# Patient Record
Sex: Male | Born: 1990
Health system: Southern US, Community
[De-identification: ages and names within clinical notes are randomized; demographics above are authoritative.]

## PROBLEM LIST (undated history)

## (undated) DIAGNOSIS — T7840XA Allergy, unspecified, initial encounter: Secondary | ICD-10-CM

## (undated) DIAGNOSIS — J45909 Unspecified asthma, uncomplicated: Secondary | ICD-10-CM

## (undated) DIAGNOSIS — R011 Cardiac murmur, unspecified: Secondary | ICD-10-CM

## (undated) DIAGNOSIS — I441 Atrioventricular block, second degree: Secondary | ICD-10-CM

## (undated) DIAGNOSIS — D573 Sickle-cell trait: Secondary | ICD-10-CM

## (undated) DIAGNOSIS — I319 Disease of pericardium, unspecified: Secondary | ICD-10-CM

## (undated) HISTORY — DX: Allergy, unspecified, initial encounter: T78.40XA

## (undated) HISTORY — DX: Unspecified asthma, uncomplicated: J45.909

## (undated) HISTORY — DX: Sickle-cell trait: D57.3

## (undated) HISTORY — DX: Atrioventricular block, second degree: I44.1

## (undated) HISTORY — DX: Cardiac murmur, unspecified: R01.1

---

## 2011-02-18 ENCOUNTER — Emergency Department (HOSPITAL_COMMUNITY)
Admission: EM | Admit: 2011-02-18 | Discharge: 2011-02-19 | Disposition: A | Discharge status: Home / Self Care | Payer: BC Managed Care – PPO | Attending: Emergency Medicine | Admitting: Emergency Medicine

## 2011-02-18 DIAGNOSIS — Y9229 Other specified public building as the place of occurrence of the external cause: Secondary | ICD-10-CM | POA: Insufficient documentation

## 2011-02-18 DIAGNOSIS — M25579 Pain in unspecified ankle and joints of unspecified foot: Secondary | ICD-10-CM | POA: Insufficient documentation

## 2011-02-18 DIAGNOSIS — X500XXA Overexertion from strenuous movement or load, initial encounter: Secondary | ICD-10-CM | POA: Insufficient documentation

## 2011-02-18 DIAGNOSIS — Y9367 Activity, basketball: Secondary | ICD-10-CM | POA: Insufficient documentation

## 2011-02-18 DIAGNOSIS — M25476 Effusion, unspecified foot: Secondary | ICD-10-CM | POA: Insufficient documentation

## 2011-02-18 DIAGNOSIS — S93409A Sprain of unspecified ligament of unspecified ankle, initial encounter: Secondary | ICD-10-CM | POA: Insufficient documentation

## 2011-02-18 DIAGNOSIS — M25473 Effusion, unspecified ankle: Secondary | ICD-10-CM | POA: Insufficient documentation

## 2011-02-19 ENCOUNTER — Emergency Department (HOSPITAL_COMMUNITY): Payer: BC Managed Care – PPO

## 2012-03-06 ENCOUNTER — Ambulatory Visit (INDEPENDENT_AMBULATORY_CARE_PROVIDER_SITE_OTHER): Payer: BC Managed Care – PPO | Admitting: Family Medicine

## 2012-03-06 VITALS — BP 119/73 | HR 87 | Temp 98.2°F | Resp 16 | Ht 67.5 in | Wt <= 1120 oz

## 2012-03-06 DIAGNOSIS — R05 Cough: Secondary | ICD-10-CM

## 2012-03-06 DIAGNOSIS — J069 Acute upper respiratory infection, unspecified: Secondary | ICD-10-CM

## 2012-03-06 DIAGNOSIS — J309 Allergic rhinitis, unspecified: Secondary | ICD-10-CM

## 2012-03-06 DIAGNOSIS — R509 Fever, unspecified: Secondary | ICD-10-CM

## 2012-03-06 DIAGNOSIS — R059 Cough, unspecified: Secondary | ICD-10-CM

## 2012-03-06 DIAGNOSIS — J029 Acute pharyngitis, unspecified: Secondary | ICD-10-CM

## 2012-03-06 LAB — POCT RAPID STREP A (OFFICE): Rapid Strep A Screen: NEGATIVE

## 2012-03-06 MED ORDER — FLUTICASONE PROPIONATE 50 MCG/ACT NA SUSP: 2.0000 | Freq: Every day | NASAL | Status: AC

## 2012-03-06 MED ORDER — HYDROCODONE-HOMATROPINE 5-1.5 MG/5ML PO SYRP: 5.0000 mL | ORAL_SOLUTION | Freq: Three times a day (TID) | ORAL | Status: AC | PRN

## 2012-03-06 NOTE — Progress Notes (Signed)
=   [facility]:  Office Visit  Chief Complaint:  Chief Complaint  Patient presents with  . Cough    prod. yellow phlegm  . Nasal Congestion    fever 100.8 (on Monday) x 3 days    HPI: Daniel Gill is a 21 y.o. male who complains of chest congestions and ptoductive cough, T max on Monday of 100.8, tried Aleve, nothing for cough. + Allergies but no Asthma. Not SOB, wheezing. No ear pain. + sore throat.    Past Medical History  Diagnosis Date  . Allergy    History reviewed. No pertinent past surgical history. History   Social History  . Marital Status: Single    Spouse Name: N/A    Number of Children: N/A  . Years of Education: N/A   Social History Main Topics  . Smoking status: Never Smoker   . Smokeless tobacco: None  . Alcohol Use: No  . Drug Use: No  . Sexually Active: None   Other Topics Concern  . None   Social History Narrative  . None   No family history on file. Allergies  Allergen Reactions  . Latex Swelling  . Sulfa Drugs Cross Reactors Hives and Swelling   Prior to Admission medications   Not on File     ROS: The patient denies fevers, chills, night sweats, unintentional weight loss, chest pain, palpitations, wheezing, dyspnea on exertion, nausea, vomiting, abdominal pain, dysuria, hematuria, melena, numbness, weakness, or tingling. + cough, sorethroat  All other systems have been reviewed and were otherwise negative with the exception of those mentioned in the HPI and as above.    PHYSICAL EXAM: Filed Vitals:   03/06/12 1539  BP: 119/73  Pulse: 87  Temp: 98.2 F (36.8 C)  Resp: 16   Filed Vitals:   03/06/12 1539  Height: 5' 7.5" (1.715 m)  Weight: 63 lb 9.6 oz (28.849 kg)   Body mass index is 9.81 kg/(m^2).  General: Alert, no acute distress HEENT:  Normocephalic, atraumatic, oropharynx patent. TM normal, throat no exudates, erythematous throat. Boggy nares Cardiovascular:  Regular rate and rhythm, no rubs  murmurs or gallops.  No Carotid bruits, radial pulse intact. No pedal edema.  Respiratory: Clear to auscultation bilaterally.  No wheezes, rales, or rhonchi.  No cyanosis, no use of accessory musculature GI: No organomegaly, abdomen is soft and non-tender, positive bowel sounds.  No masses. Skin: No rashes. Neurologic: Facial musculature symmetric. Psychiatric: Patient is appropriate throughout our interaction. Lymphatic: No cervical lymphadenopathy Musculoskeletal: Gait intact.   LABS: Results for orders placed in visit on 03/06/12  POCT RAPID STREP A (OFFICE)      Component Value Range   Rapid Strep A Screen Negative  Negative      EKG/XRAY:   Primary read interpreted by Dr. Conley Rolls at [facility].   ASSESSMENT/PLAN: Encounter Diagnoses  Name Primary?  . Sore throat Yes  . Viral URI with cough   . Allergic rhinitis   . Cough   . Fever    1. OTC meds: antihistamine,  rx flonase 2. OTC cough medicine, gave rx for hydromet prn 3. Sxs treatment for now.     Hamilton Capri PHUONG, DO 03/06/2012 4:46 PM

## 2012-12-20 ENCOUNTER — Ambulatory Visit (INDEPENDENT_AMBULATORY_CARE_PROVIDER_SITE_OTHER): Payer: BC Managed Care – PPO | Admitting: Family Medicine

## 2012-12-20 VITALS — BP 117/76 | HR 67 | Temp 97.9°F | Resp 16 | Ht 68.0 in | Wt 157.2 lb

## 2012-12-20 DIAGNOSIS — H8110 Benign paroxysmal vertigo, unspecified ear: Secondary | ICD-10-CM

## 2012-12-20 MED ORDER — MECLIZINE HCL 12.5 MG PO TABS: 12.5000 mg | ORAL_TABLET | Freq: Three times a day (TID) | ORAL | Status: DC | PRN

## 2012-12-20 NOTE — Progress Notes (Signed)
22 yo [employer] worker with dizziness x 24 hours, abrupt onset upon arising.  Symptoms worsen with sudden movement.  No loss of hearing  Or ear pain.  Some slight ringing in ears, but he notes exposure to loud noise.  No nausea or vomiting.  Obj:  NAD Normal TM's, eomi, Perla, no nystagmus, no cervical adenopathy, chest is clear, heart reg without murmur Neuro:  Alert, coop, normal CN III-XII and normal motor  Assessment:  vertigo

## 2012-12-20 NOTE — Patient Instructions (Addendum)
Benign Positional Vertigo  Vertigo means you feel like you or your surroundings are moving when they are not. Benign positional vertigo is the most common form of vertigo. Benign means that the cause of your condition is not serious. Benign positional vertigo is more common in older adults.  CAUSES   Benign positional vertigo is the result of an upset in the labyrinth system. This is an area in the middle ear that helps control your balance. This may be caused by a viral infection, head injury, or repetitive motion. However, often no specific cause is found.  SYMPTOMS   Symptoms of benign positional vertigo occur when you move your head or eyes in different directions. Some of the symptoms may include:  · Loss of balance and falls.  · Vomiting.  · Blurred vision.  · Dizziness.  · Nausea.  · Involuntary eye movements (nystagmus).  DIAGNOSIS   Benign positional vertigo is usually diagnosed by physical exam. If the specific cause of your benign positional vertigo is unknown, your caregiver may perform imaging tests, such as magnetic resonance imaging (MRI) or computed tomography (CT).  TREATMENT   Your caregiver may recommend movements or procedures to correct the benign positional vertigo. Medicines such as meclizine, benzodiazepines, and medicines for nausea may be used to treat your symptoms. In rare cases, if your symptoms are caused by certain conditions that affect the inner ear, you may need surgery.  HOME CARE INSTRUCTIONS   · Follow your caregiver's instructions.  · Move slowly. Do not make sudden body or head movements.  · Avoid driving.  · Avoid operating heavy machinery.  · Avoid performing any tasks that would be dangerous to you or others during a vertigo episode.  · Drink enough fluids to keep your urine clear or pale yellow.  SEEK IMMEDIATE MEDICAL CARE IF:   · You develop problems with walking, weakness, numbness, or using your arms, hands, or legs.  · You have difficulty speaking.  · You develop  severe headaches.  · Your nausea or vomiting continues or gets worse.  · You develop visual changes.  · Your family or friends notice any behavioral changes.  · Your condition gets worse.  · You have a fever.  · You develop a stiff neck or sensitivity to light.  MAKE SURE YOU:   · Understand these instructions.  · Will watch your condition.  · Will get help right away if you are not doing well or get worse.  Document Released: 09/04/2006 Document Revised: 02/19/2012 Document Reviewed: 08/17/2011  [employer]® Patient Information ©2013 [employer].

## 2013-03-03 ENCOUNTER — Ambulatory Visit: Payer: BC Managed Care – PPO

## 2013-03-03 ENCOUNTER — Ambulatory Visit (INDEPENDENT_AMBULATORY_CARE_PROVIDER_SITE_OTHER): Payer: BC Managed Care – PPO | Admitting: Family Medicine

## 2013-03-03 VITALS — BP 118/78 | HR 64 | Temp 98.4°F | Resp 18 | Ht 68.0 in | Wt 154.0 lb

## 2013-03-03 DIAGNOSIS — M25579 Pain in unspecified ankle and joints of unspecified foot: Secondary | ICD-10-CM

## 2013-03-03 DIAGNOSIS — M25571 Pain in right ankle and joints of right foot: Secondary | ICD-10-CM

## 2013-03-03 DIAGNOSIS — S9031XA Contusion of right foot, initial encounter: Secondary | ICD-10-CM

## 2013-03-03 DIAGNOSIS — S9030XA Contusion of unspecified foot, initial encounter: Secondary | ICD-10-CM

## 2013-03-03 NOTE — Progress Notes (Signed)
  Subjective:    Patient ID: Daniel Gill, male    DOB: [birth year], 22 y.o.   MRN: [id number]  HPI 22 year old male presentss with acute right ankle injury. He works at [employer] and on 02/27/13 he dropped a package that struck his right anterior ankle.  No sprain or strain.  He has been using an old short cam walker that he had from a prior ankle injury - this does help him ambulate and decreases the pain, but he does admit that it has become increasingly painful over the last few days. Denies paresthesias or weakness.  He has iced the area but not taken any OTC medications for this.    He is otherwise healthy with no other concerns today.     Review of Systems  Constitutional: Negative for fever and chills.  Musculoskeletal: Positive for joint swelling, arthralgias and gait problem.  Skin: Negative for color change and wound.  Neurological: Negative for dizziness.       Objective:   Physical Exam  Constitutional: He is oriented to person, place, and time. He appears well-developed and well-nourished.  HENT:  Head: Normocephalic and atraumatic.  Right Ear: External ear normal.  Left Ear: External ear normal.  Neck: Normal range of motion.  Cardiovascular: Normal rate, regular rhythm and normal heart sounds.   Pulmonary/Chest: Effort normal and breath sounds normal.  Musculoskeletal:       Right ankle: He exhibits decreased range of motion (secondary to pain). He exhibits no swelling, no ecchymosis, no laceration and normal pulse. Tenderness. AITFL tenderness found. No lateral malleolus, no medial malleolus and no head of 5th metatarsal tenderness found.  Neurological: He is alert and oriented to person, place, and time.  Psychiatric: He has a normal mood and affect. His behavior is normal. Judgment and thought content normal.     [facility] reading (PRIMARY) by  Dr. Conley Rolls as no acute bony abnoromality.       Assessment & Plan:   Pain in joint, ankle and foot, right - Plan: DG Ankle  Complete Right  Contusion, foot, right, initial encounter  Continue to wear short cam as needed. As pain improves, try short distances without it.  Ibuprofen 600 mg tid for pain and inflammation Ice and elevation Out of work until 03/10/13. If pain continues through next week, need to RTC for further evaluation.

## 2013-03-03 NOTE — Patient Instructions (Addendum)
Ibuprofen 600 mg three times a day Ice and elevation Follow up in 1 week if pain persists.

## 2013-03-19 ENCOUNTER — Ambulatory Visit (INDEPENDENT_AMBULATORY_CARE_PROVIDER_SITE_OTHER): Payer: BC Managed Care – PPO | Admitting: Family Medicine

## 2013-03-19 VITALS — BP 118/88 | HR 64 | Temp 98.1°F | Resp 16 | Ht 68.0 in | Wt 154.0 lb

## 2013-03-19 DIAGNOSIS — M25579 Pain in unspecified ankle and joints of unspecified foot: Secondary | ICD-10-CM

## 2013-03-19 DIAGNOSIS — M25571 Pain in right ankle and joints of right foot: Secondary | ICD-10-CM

## 2013-03-19 DIAGNOSIS — T148XXA Other injury of unspecified body region, initial encounter: Secondary | ICD-10-CM

## 2013-03-19 NOTE — Progress Notes (Signed)
[facility]:  Office Visit  Chief Complaint:  Chief Complaint  Patient presents with  . Follow-up    foot pain    HPI: Daniel Gill is a 22 y.o. male who complains of  Right ankle pain which he was evaluated for 2 weeks ago. Was playing basketball about 3 weeks ago and may have strained it, he has had priuor ankle injuries. Sharp pain, intermittent, no radiation. Gets numb after walking on it for a while and weightbearing. It has improved with no working but then he went back to work at [employer] and starting lifting heavy weights and started limping--avg weight 70 lbs. No prior surgeries on foot or ankles. Has tried ibuprofen 400 mg QID wihtout relief, also uses his short camwalker. He has to wera steel toe shoes at work, tried insoles but did not help. No exercises at home.   Past Medical History  Diagnosis Date  . Allergy    No past surgical history on file. History   Social History  . Marital Status: Single    Spouse Name: N/A    Number of Children: N/A  . Years of Education: N/A   Social History Main Topics  . Smoking status: Never Smoker   . Smokeless tobacco: None  . Alcohol Use: No  . Drug Use: No  . Sexually Active: None   Other Topics Concern  . None   Social History Narrative  . None   No family history on file. Allergies  Allergen Reactions  . Latex Swelling  . Sulfa Drugs Cross Reactors Hives and Swelling   Prior to Admission medications   Medication Sig Start Date End Date Taking? Authorizing Provider  cetirizine (ZYRTEC) 10 MG tablet Take 10 mg by mouth daily.   Yes Historical Provider, MD  meclizine (ANTIVERT) 12.5 MG tablet Take 1 tablet (12.5 mg total) by mouth 3 (three) times daily as needed. 12/20/12   Elvina Sidle, MD     ROS: The patient denies fevers, chills, night sweats, unintentional weight loss, chest pain, palpitations, wheezing, dyspnea on exertion, nausea, vomiting, abdominal pain, dysuria, hematuria, melena,  weakness, or tingling. + numbness,   All other systems have been reviewed and were otherwise negative with the exception of those mentioned in the HPI and as above.    PHYSICAL EXAM: Filed Vitals:   03/19/13 1215  BP: 118/88  Pulse: 64  Temp: 98.1 F (36.7 C)  Resp: 16   Filed Vitals:   03/19/13 1215  Height: 5\' 8"  (1.727 m)  Weight: 154 lb (69.854 kg)   Body mass index is 23.42 kg/(m^2).  General: Alert, no acute distress HEENT:  Normocephalic, atraumatic, oropharynx patent.  Cardiovascular:  Regular rate and rhythm, no rubs murmurs or gallops.  No Carotid bruits, radial pulse intact. No pedal edema.  Respiratory: Clear to auscultation bilaterally.  No wheezes, rales, or rhonchi.  No cyanosis, no use of accessory musculature GI: No organomegaly, abdomen is soft and non-tender, positive bowel sounds.  No masses. Skin: No rashes. Neurologic: Facial musculature symmetric. Psychiatric: Patient is appropriate throughout our interaction. Lymphatic: No cervical lymphadenopathy Musculoskeletal: Gait intact. Right  foot-nl Right ankle-+ tenderness on squeeze test, neg ant drawer, 5/5 strength, sensation intact, + DP, good cap refill   LABS: Results for orders placed in visit on 03/06/12  POCT RAPID STREP A (OFFICE)      Result Value Range   Rapid Strep A Screen Negative  Negative     EKG/XRAY:   Primary read  interpreted by Dr. Conley Rolls at [facility].   ASSESSMENT/PLAN: Encounter Diagnoses  Name Primary?  . Pain in joint, ankle and foot, right Yes  . Sprain and strain    Most likely high ankle sprain He was improving but got worse after started working nad walking on hard cement at [employer] in steel toe shoes Will defer xrays until next visit if needed Gave ROM and exercises to do.  C/w cam walker, increase ibuprfen to 600 mg TID with food.  Note for work was given  F/u in 1 week     Mitchell Iwanicki PHUONG, DO 03/19/2013 1:18 PM

## 2013-03-19 NOTE — Patient Instructions (Signed)
Acute Ankle Sprain with Phase II Rehab An acute ankle sprain is a partial or complete tear in one or more of the ligaments of the ankle due to traumatic injury. The severity of the injury depends on both the number of ligaments sprained and the grade of sprain. There are 3 grades of sprains.  A grade 1 sprain is a mild sprain. There is a slight pull without obvious tearing. There is no loss of strength, and the muscle and ligament are the correct length.  A grade 2 sprain is a moderate sprain. There is tearing of fibers within the substance of the ligament where it connects two bones or two cartilages. The length of the ligament is increased, and there is usually decreased strength.  A grade 3 sprain is a complete rupture of the ligament and is uncommon. In addition to the grade of sprain, there are 3 types of ankle sprains.  Lateral ankle sprains. This is a sprain of one or more of the 3 ligaments on the outer side (lateral) of the ankle. These are the most common sprains. Medial ankle sprains. There is one large triangular ligament on the inner side (medial) of the ankle that is susceptible to injury. Medial ankle sprains are less common. Syndesmosis, "high ankle," sprains. The syndesmosis is the ligament that connects the two bones of the lower leg. Syndesmosis sprains usually only occur with very severe ankle sprains. SYMPTOMS  Pain, tenderness, and swelling in the ankle, starting at the side of injury that may progress to the whole ankle and foot with time.  "Pop" or tearing sensation at the time of injury.  Bruising that may spread to the heel.  Impaired ability to walk soon after injury. CAUSES   Acute ankle sprains are caused by trauma placed on the ankle that temporarily forces or pries the anklebone (talus) out of its normal socket.  Stretching or tearing of the ligaments that normally hold the joint in place (usually due to a twisting injury). RISK INCREASES WITH:  Previous  ankle sprain.  Sports in which the foot may land awkwardly (basketball, volleyball, soccer) or walking or running on uneven or rough surfaces.  Shoes with inadequate support to prevent sideways motion when stress occurs.  Poor strength and flexibility.  Poor balance skills.  Contact sports. PREVENTION  Warm up and stretch properly before activity.  Maintain physical fitness:  Ankle and leg flexibility, muscle strength, and endurance.  Cardiovascular fitness.  Balance training activities.  Use proper technique and have a coach correct improper technique.  Taping, protective strapping, bracing, or high-top tennis shoes may help prevent injury. Initially, tape is best. However, it loses most of its support function within 10 to 15 minutes.  Wear proper fitted protective shoes. Combining high-top shoes with taping or bracing is more effective than using either alone.  Provide the ankle with support during sports and practice activities for 12 months following injury. PROGNOSIS   If treated properly, ankle sprains can be expected to recover completely. However, the length of recovery depends on the degree of injury.  A grade 1 sprain usually heals enough in 5 to 7 days to allow modified activity and requires an average of 6 weeks to heal completely.  A grade 2 sprain requires 6 to 10 weeks to heal completely.  A grade 3 sprain requires 12 to 16 weeks to heal.  A syndesmosis sprain often takes more than 3 months to heal. RELATED COMPLICATIONS   Frequent recurrence of symptoms may result   in a chronic problem. Appropriately addressing the problem the first time decreases the frequency of recurrence and optimizes healing time. Severity of initial sprain does not predict the likelihood of later instability.  Injury to other structures (bone, cartilage, or tendon).  Chronically unstable or arthritic ankle joint are possible with repeated sprains. TREATMENT Treatment initially  involves the use of ice, medicine, and compression bandages to help reduce pain and inflammation. Ankle sprains are usually immobilized in a walking cast or boot to allow for healing. Crutches may be recommended to reduce pressure on the injury. After immobilization, strengthening and stretching exercises may be necessary to regain strength and a full range of motion. Surgery is rarely needed to treat ankle sprains. MEDICATION   Nonsteroidal anti-inflammatory medicines, such as aspirin and ibuprofen (do not take for the first 3 days after injury or within 7 days before surgery), or other minor pain relievers, such as acetaminophen, are often recommended. Take these as directed by your caregiver. Contact your caregiver immediately if any bleeding, stomach upset, or signs of an allergic reaction occur from these medicines.  Ointments applied to the skin may be helpful.  Pain relievers may be prescribed as necessary by your caregiver. Do not take prescription pain medicine for longer than 4 to 7 days. Use only as directed and only as much as you need. HEAT AND COLD  Cold treatment (icing) is used to relieve pain and reduce inflammation for acute and chronic cases. Cold should be applied for 10 to 15 minutes every 2 to 3 hours for inflammation and pain and immediately after any activity that aggravates your symptoms. Use ice packs or an ice massage.  Heat treatment may be used before performing stretching and strengthening activities prescribed by your caregiver. Use a heat pack or a warm soak. SEEK IMMEDIATE MEDICAL CARE IF:   Pain, swelling, or bruising worsens despite treatment.  You experience pain, numbness, discoloration, or coldness in the foot or toes.  New, unexplained symptoms develop. (Drugs used in treatment may produce side effects.) EXERCISES  PHASE II EXERCISES RANGE OF MOTION (ROM) AND STRETCHING EXERCISES - Ankle Sprain, Acute-Phase II, Weeks 3 to 4 After your physician, physical  therapist, or athletic trainer feels your knee has made progress significant enough to begin more advanced exercises, he or she may recommend completing some of the following exercises. Although each person heals at different rates, most people will be ready for these exercises between 3 and 4 weeks after their injury. Do not begin these exercises until you have your caregiver's permission. He or she may also advise you to continue with the exercises which you completed in Phase I of your rehabilitation. While completing these exercises, remember:   Restoring tissue flexibility helps normal motion to return to the joints. This allows healthier, less painful movement and activity.  An effective stretch should be held for at least 30 seconds.  A stretch should never be painful. You should only feel a gentle lengthening or release in the stretched tissue. RANGE OF MOTION - Ankle Plantar Flexion   Sit with your right / left leg crossed over your opposite knee.  Use your opposite hand to pull the top of your foot and toes toward you.  You should feel a gentle stretch on the top of your foot/ankle. Hold this position for __________. Repeat __________ times. Complete __________ times per day.  RANGE OF MOTION - Ankle Eversion  Sit with your right / left ankle crossed over your opposite knee.    Grip your foot with your opposite hand, placing your thumb on the top of your foot and your fingers across the bottom of your foot.  Gently push your foot downward with a slight rotation so your littlest toes rise slightly  You should feel a gentle stretch on the inside of your ankle. Hold the stretch for __________ seconds. Repeat __________ times. Complete this exercise __________ times per day.  RANGE OF MOTION - Ankle Inversion  Sit with your right / left ankle crossed over your opposite knee.  Grip your foot with your opposite hand, placing your thumb on the bottom of your foot and your fingers across  the top of your foot.  Gently pull your foot so the smallest toe comes toward you and your thumb pushes the inside of the ball of your foot away from you.  You should feel a gentle stretch on the outside of your ankle. Hold the stretch for __________ seconds. Repeat __________ times. Complete this exercise __________ times per day.  STRETCH - Gastrocsoleus  Sit with your right / left leg extended. Holding onto both ends of a belt or towel, loop it around the ball of your foot.  Keeping your right / left ankle and foot relaxed and your knee straight, pull your foot and ankle toward you using the belt/towel.  You should feel a gentle stretch behind your calf or knee. Hold this position for __________ seconds. Repeat __________ times. Complete this stretch __________ times per day.  RANGE OF MOTION - Ankle Dorsiflexion, Active Assisted  Remove shoes and sit on a chair that is preferably not on a carpeted surface.  Place right / left foot under knee. Extend your opposite leg for support.  Keeping your heel down, slide your right / left foot back toward the chair until you feel a stretch at your ankle or calf. If you do not feel a stretch, slide your bottom forward to the edge of the chair while still keeping your heel down.  Hold this stretch for __________ seconds. Repeat __________ times. Complete this stretch __________ times per day.  STRETCH  Gastroc, Standing   Place hands on wall.  Extend right / left leg and place a folded washcloth under the arch of your foot for support. Keep the front knee somewhat bent.  Slightly point your toes inward on your back foot.  Keeping your right / left heel on the floor and your knee straight, shift your weight toward the wall, not allowing your back to arch.  You should feel a gentle stretch in the calf. Hold this position for __________ seconds. Repeat __________ times. Complete this stretch __________ times per day. STRETCH  Soleus,  Standing  Place hands on wall.  Extend right / left leg and place a folded washcloth under the arch of your foot for support. Keep the front knee somewhat bent.  Slightly point your toes inward on your back foot.  Keep your right / left heel on the floor, bend your back knee, and slightly shift your weight over the back leg so that you feel a gentle stretch deep in your back calf.  Hold this position for __________ seconds. Repeat __________ times. Complete this stretch __________ times per day. STRETCH  Gastrocsoleus, Standing Note: This exercise can place a lot of stress on your foot and ankle. Please complete this exercise only if specifically instructed by your caregiver.   Place the ball of your right / left foot on a step, keeping your other  foot firmly on the same step.  Hold on to the wall or a rail for balance.  Slowly lift your other foot, allowing your body weight to press your heel down over the edge of the step.  You should feel a stretch in your right / left calf.  Hold this position for __________ seconds.  Repeat this exercise with a slight bend in your knee. Repeat __________ times. Complete this stretch __________ times per day.  STRENGTHENING EXERCISES - Ankle Sprain, Acute-Phase II Around 3 to 4 weeks after your injury, you may progress to some of these exercises in your rehabilitation program. Do not begin these until you have your caregiver's permission. Although your condition has improved, the Phase I exercises will continue to be helpful and you may continue to complete them. As you complete strengthening exercises, remember:   Strong muscles with good endurance tolerate stress better.  Do the exercises as initially prescribed by your caregiver. Progress slowly with each exercise, gradually increasing the number of repetitions and weight used under his or her guidance.  You may experience muscle soreness or fatigue, but the pain or discomfort you are trying  to eliminate should never worsen during these exercises. If this pain does worsen, stop and make certain you are following the directions exactly. If the pain is still present after adjustments, discontinue the exercise until you can discuss the trouble with your caregiver. STRENGTH - Plantar-flexors, Standing  Stand with your feet shoulder width apart. Steady yourself with a wall or table using as little support as needed.  Keeping your weight evenly spread over the width of your feet, rise up on your toes.*  Hold this position for __________ seconds. Repeat __________ times. Complete this exercise __________ times per day.  *If this is too easy, shift your weight toward your right / left leg until you feel challenged. Ultimately, you may be asked to do this exercise with your right / left foot only. STRENGTH  Dorsiflexors and Plantar-flexors, Heel/toe Walking  Dorsiflexion: Walk on your heels only. Keep your toes as high as possible.  Walk for ____________________ seconds/feet.  Repeat __________ times. Complete __________ times per day.  Plantar flexion: Walk on your toes only. Keep your heels as high as possible.  Walk for ____________________ seconds/feet. Repeat __________ times. Complete __________ times per day.  BALANCE  Tandem Walking  Place your uninjured foot on a line 2 to 4 inches wide and at least 10 feet long.  Keeping your balance without using anything for extra support, place your right / left heel directly in front of your other foot.  Slowly raise your back foot up, lifting from the heel to the toes, and place it directly in front of the right / left foot.  Continue to walk along the line slowly. Walk for ____________________ feet. Repeat ____________________ times. Complete ____________________ times per day. BALANCE - Inversion/Eversion Use caution, these are advanced level exercises. Do not begin them until you are advised to do so.   Create a balance board  using a sturdy board about 1  feet long and at 1 to 1  feet wide and a 1  inch diameter rod or pipe that is as long as the board's width. A copper pipe or a solid broomstick work well.  Stand on a non-carpeted surface near a countertop or wall. Step onto the board so that your feet are hip-width apart and equally straddle the rod/pipe.  Keeping your feet in place, complete these two exercises  without shifting your upper body or hips:  Tip the board from side-to-side. Control the movement so the board does not forcefully strike the ground. The board should silently tap the ground.  Tip the board side-to-side without striking the ground. Occasionally pause and maintain a steady position at various points.  Repeat the first two exercises, but use only your right / left foot. Place your right / left foot directly over the rod/pipe. Repeat __________ times. Complete this exercise __________ times a day. BALANCE - Plantar/Dorsi Flexion Use caution, these are advanced level exercises. Do not begin them until you are advised to do so.   Create a balance board using a sturdy board about 1  feet long and at 1 to 1  feet wide and a 1  inch diameter rod or pipe that is as long as the board's width. A copper pipe or a solid broomstick work well.  Stand on a non-carpeted surface near a countertop or wall. Stand on the board so that the rod/pipe runs under the arches in your feet.  Keeping your feet in place, complete these two exercises without shifting your upper body or hips:  Tip the board from side-to-side. Control the movement so the board does not forcefully strike the ground. The board should silently tap the ground.  Tip the board side-to-side without striking the ground. Occasionally pause and maintain a steady position at various points.  Repeat the first two exercises, but use only your right / left foot. Stand in the center of the board. Repeat __________ times. Complete this exercise  __________ times a day. STRENGTH  Plantar-flexors, Eccentric Note: This exercise can place a lot of stress on your foot and ankle. Please complete this exercise only if specifically instructed by your caregiver.   Place the balls of your feet on a step. With your hands, use only enough support from a wall or rail to keep your balance.  Keep your knees straight and rise up on your toes.  Slowly shift your weight entirely to your toes and pick up your opposite foot. Gently and with controlled movement, lower your weight through your right / left foot so that your heel drops below the level of the step. You will feel a slight stretch in the back of your calf at the ending position.  Use the healthy leg to help rise up onto the balls of both feet, then lower weight only on the right / left leg again. Build up to 15 repetitions. Then progress to 3 consecutive sets of 15 repetitions.*  After completing the above exercise, complete the same exercise with a slight knee bend (about 30 degrees). Again, build up to 15 repetitions. Then progress to 3 consecutive sets of 15 repetitions.* Perform this exercise __________ times per day.  *When you easily complete 3 sets of 15, your physician, physical therapist, or athletic trainer may advise you to add resistance by wearing a backpack filled with additional weight. Document Released: 03/19/2006 Document Revised: 02/19/2012 Document Reviewed: 03/11/2009 [facility] Patient Information 2013 [place], [place]. Acute Ankle Sprain with Phase I Rehab An acute ankle sprain is a partial or complete tear in one or more of the ligaments of the ankle due to traumatic injury. The severity of the injury depends on both the the number of ligaments sprained and the grade of sprain. There are 3 grades of sprains.   A grade 1 sprain is a mild sprain. There is a slight pull without obvious tearing. There is no  loss of strength, and the muscle and ligament are the correct  length.  A grade 2 sprain is a moderate sprain. There is tearing of fibers within the substance of the ligament where it connects two bones or two cartilages. The length of the ligament is increased, and there is usually decreased strength.  A grade 3 sprain is a complete rupture of the ligament and is uncommon. In addition to the grade of sprain, there are three types of ankle sprains.  Lateral ankle sprains: This is a sprain of one or more of the three ligaments on the outer side (lateral) of the ankle. These are the most common sprains. Medial ankle sprains: There is one large triangular ligament of the inner side (medial) of the ankle that is susceptible to injury. Medial ankle sprains are less common. Syndesmosis, "high ankle," sprains: The syndesmosis is the ligament that connects the two bones of the lower leg. Syndesmosis sprains usually only occur with very severe ankle sprains. SYMPTOMS  Pain, tenderness, and swelling in the ankle, starting at the side of injury that may progress to the whole ankle and foot with time.  "Pop" or tearing sensation at the time of injury.  Bruising that may spread to the heel.  Impaired ability to walk soon after injury. CAUSES   Acute ankle sprains are caused by trauma placed on the ankle that temporarily forces or pries the anklebone (talus) out of its normal socket.  Stretching or tearing of the ligaments that normally hold the joint in place (usually due to a twisting injury). RISK INCREASES WITH:  Previous ankle sprain.  Sports in which the foot may land awkwardly (ie. basketball, volleyball, or soccer) or walking or running on uneven or rough surfaces.  Shoes with inadequate support to prevent sideways motion when stress occurs.  Poor strength and flexibility.  Poor balance skills.  Contact sports. PREVENTION   Warm up and stretch properly before activity.  Maintain physical fitness:  Ankle and leg flexibility, muscle strength,  and endurance.  Cardiovascular fitness.  Balance training activities.  Use proper technique and have a coach correct improper technique.  Taping, protective strapping, bracing, or high-top tennis shoes may help prevent injury. Initially, tape is best; however, it loses most of its support function within 10 to 15 minutes.  Wear proper fitted protective shoes (High-top shoes with taping or bracing is more effective than either alone).  Provide the ankle with support during sports and practice activities for 12 months following injury. PROGNOSIS   If treated properly, ankle sprains can be expected to recover completely; however, the length of recovery depends on the degree of injury.  A grade 1 sprain usually heals enough in 5 to 7 days to allow modified activity and requires an average of 6 weeks to heal completely.  A grade 2 sprain requires 6 to 10 weeks to heal completely.  A grade 3 sprain requires 12 to 16 weeks to heal.  A syndesmosis sprain often takes more than 3 months to heal. RELATED COMPLICATIONS   Frequent recurrence of symptoms may result in a chronic problem. Appropriately addressing the problem the first time decreases the frequency of recurrence and optimizes healing time. Severity of the initial sprain does not predict the likelihood of later instability.  Injury to other structures (bone, cartilage, or tendon).  A chronically unstable or arthritic ankle joint is a possiblity with repeated sprains. TREATMENT Treatment initially involves the use of ice, medication, and compression bandages to help reduce pain and  inflammation. Ankle sprains are usually immobilized in a walking cast or boot to allow for healing. Crutches may be recommended to reduce pressure on the injury. After immobilization, strengthening and stretching exercises may be necessary to regain strength and a full range of motion. Surgery is rarely needed to treat ankle sprains. MEDICATION    Nonsteroidal anti-inflammatory medications, such as aspirin and ibuprofen (do not take for the first 3 days after injury or within 7 days before surgery), or other minor pain relievers, such as acetaminophen, are often recommended. Take these as directed by your caregiver. Contact your caregiver immediately if any bleeding, stomach upset, or signs of an allergic reaction occur from these medications.  Ointments applied to the skin may be helpful.  Pain relievers may be prescribed as necessary by your caregiver. Do not take prescription pain medication for longer than 4 to 7 days. Use only as directed and only as much as you need. HEAT AND COLD  Cold treatment (icing) is used to relieve pain and reduce inflammation for acute and chronic cases. Cold should be applied for 10 to 15 minutes every 2 to 3 hours for inflammation and pain and immediately after any activity that aggravates your symptoms. Use ice packs or an ice massage.  Heat treatment may be used before performing stretching and strengthening activities prescribed by your caregiver. Use a heat pack or a warm soak. SEEK IMMEDIATE MEDICAL CARE IF:   Pain, swelling, or bruising worsens despite treatment.  You experience pain, numbness, discoloration, or coldness in the foot or toes.  New, unexplained symptoms develop (drugs used in treatment may produce side effects.) EXERCISES  PHASE I EXERCISES RANGE OF MOTION (ROM) AND STRETCHING EXERCISES - Ankle Sprain, Acute Phase I, Weeks 1 to 2 These exercises may help you when beginning to restore flexibility in your ankle. You will likely work on these exercises for the 1 to 2 weeks after your injury. Once your physician, physical therapist, or athletic trainer sees adequate progress, he or she will advance your exercises. While completing these exercises, remember:   Restoring tissue flexibility helps normal motion to return to the joints. This allows healthier, less painful movement and  activity.  An effective stretch should be held for at least 30 seconds.  A stretch should never be painful. You should only feel a gentle lengthening or release in the stretched tissue. RANGE OF MOTION - Dorsi/Plantar Flexion  While sitting with your right / left knee straight, draw the top of your foot upwards by flexing your ankle. Then reverse the motion, pointing your toes downward.  Hold each position for __________ seconds.  After completing your first set of exercises, repeat this exercise with your knee bent. Repeat __________ times. Complete this exercise __________ times per day.  RANGE OF MOTION - Ankle Alphabet  Imagine your right / left big toe is a pen.  Keeping your hip and knee still, write out the entire alphabet with your "pen." Make the letters as large as you can without increasing any discomfort. Repeat __________ times. Complete this exercise __________ times per day.  STRENGTHENING EXERCISES - Ankle Sprain, Acute -Phase I, Weeks 1 to 2 These exercises may help you when beginning to restore strength in your ankle. You will likely work on these exercises for 1 to 2 weeks after your injury. Once your physician, physical therapist, or athletic trainer sees adequate progress, he or she will advance your exercises. While completing these exercises, remember:   Muscles can gain both the  endurance and the strength needed for everyday activities through controlled exercises.  Complete these exercises as instructed by your physician, physical therapist, or athletic trainer. Progress the resistance and repetitions only as guided.  You may experience muscle soreness or fatigue, but the pain or discomfort you are trying to eliminate should never worsen during these exercises. If this pain does worsen, stop and make certain you are following the directions exactly. If the pain is still present after adjustments, discontinue the exercise until you can discuss the trouble with your  clinician. STRENGTH - Dorsiflexors  Secure a rubber exercise band/tubing to a fixed object (ie. table, pole) and loop the other end around your right / left foot.  Sit on the floor facing the fixed object. The band/tubing should be slightly tense when your foot is relaxed.  Slowly draw your foot back toward you using your ankle and toes.  Hold this position for __________ seconds. Slowly release the tension in the band and return your foot to the starting position. Repeat __________ times. Complete this exercise __________ times per day.  STRENGTH - Plantar-flexors   Sit with your right / left leg extended. Holding onto both ends of a rubber exercise band/tubing, loop it around the ball of your foot. Keep a slight tension in the band.  Slowly push your toes away from you, pointing them downward.  Hold this position for __________ seconds. Return slowly, controlling the tension in the band/tubing. Repeat __________ times. Complete this exercise __________ times per day.  STRENGTH - Ankle Eversion  Secure one end of a rubber exercise band/tubing to a fixed object (table, pole). Loop the other end around your foot just before your toes.  Place your fists between your knees. This will focus your strengthening at your ankle.  Drawing the band/tubing across your opposite foot, slowly, pull your little toe out and up. Make sure the band/tubing is positioned to resist the entire motion.  Hold this position for __________ seconds. Have your muscles resist the band/tubing as it slowly pulls your foot back to the starting position.  Repeat __________ times. Complete this exercise __________ times per day.  STRENGTH - Ankle Inversion  Secure one end of a rubber exercise band/tubing to a fixed object (table, pole). Loop the other end around your foot just before your toes.  Place your fists between your knees. This will focus your strengthening at your ankle.  Slowly, pull your big toe up and  in, making sure the band/tubing is positioned to resist the entire motion.  Hold this position for __________ seconds.  Have your muscles resist the band/tubing as it slowly pulls your foot back to the starting position. Repeat __________ times. Complete this exercises __________ times per day.  STRENGTH - Towel Curls  Sit in a chair positioned on a non-carpeted surface.  Place your right / left foot on a towel, keeping your heel on the floor.  Pull the towel toward your heel by only curling your toes. Keep your heel on the floor.  If instructed by your physician, physical therapist, or athletic trainer, add weight to the end of the towel. Repeat __________ times. Complete this exercise __________ times per day. Document Released: 06/28/2005 Document Revised: 02/19/2012 Document Reviewed: 03/11/2009 [facility] Patient Information 2013 [place], [place].

## 2014-07-02 ENCOUNTER — Ambulatory Visit (INDEPENDENT_AMBULATORY_CARE_PROVIDER_SITE_OTHER): Payer: BC Managed Care – PPO | Admitting: Family Medicine

## 2014-07-02 VITALS — BP 98/70 | HR 65 | Temp 98.3°F | Resp 16 | Ht 68.0 in | Wt 162.0 lb

## 2014-07-02 DIAGNOSIS — H168 Other keratitis: Secondary | ICD-10-CM

## 2014-07-02 MED ORDER — MOXIFLOXACIN HCL 0.5 % OP SOLN: 1.0000 [drp] | OPHTHALMIC | Status: DC

## 2014-07-02 NOTE — Progress Notes (Signed)
  Daniel Gill - 23 y.o. male MRN [id number]  Date of birth: Jan 23, 1991  SUBJECTIVE:  Including CC & ROS.  The patient presents for evaluation of: Left Eye Pain: Patient reports 48 hours of left eye pain (2/10 severity) that his progressive photophobia, redness and difficulty focusing on objects. He denies any trauma or incident that would be associated with a foreign body but he does have a foreign body sensation. He has not tried any medications. He is a contact lens wearer but denies sleeping in them overnight. He has never had any prior eye issues other than poor vision. No family history of vision or eye problems. No other medical issues.  He denies any fevers chills, cough, congestion, sore throat, rhinorrhea or significant allergic symptoms.    HISTORY: Past Medical, Surgical, Social, and Family History Reviewed & Updated per EMR. Pertinent Historical Findings include: Sulfa drugs  PHYSICAL EXAM:  VS: BP:98/70 mmHg  HR:65bpm  TEMP:98.3 F (36.8 C)( )  RESP:99 %  HT:5\' 8"  (172.7 cm)   WT:162 lb (73.483 kg)  BMI:24.7 PHYSICAL EXAM: GENERAL:  Young adult athletic African American male. In no discomfort; no respiratory distress  PSYCH:  alert and appropriate, good insight  Eye exam: Left eye has marked scleral erythema consistent with scleritis. no significant periorbital edema, swelling or erythema. No conjunctivitis. ScExtraocular muscles are intact but he does have pain with motion upward and outward. He was visual acuity is 20/20 in both eyes with right eye correction (contacts). Visual inspection reveals 2 small punctate lesions less than 1 mm in size at the 3:00 and 4:00 position of the cornea. Fluorescein staining with magnified woods lamp shows ulcerations at the above positions with no other signs of significant corneal abrasion or foreign body.  ASSESSMENT & PLAN: See problem based charting & AVS for pt instructions.

## 2014-07-02 NOTE — Patient Instructions (Addendum)
You need to use the antibiotic drops every one to 2 hours including overnight. DO NOT WEAR CONTACTS. Follow up with Dr. Dione BoozeGroat tomorrow morning.  Call his office at [phone] and they will schedule an appointment for sometime in the morning.  [facility] [address], [place], [place] [address] [phone]

## 2014-07-02 NOTE — Assessment & Plan Note (Signed)
Acute condition  - Given hx of contact lens wear and findings on exam concern for acute bacterial keratitis.  Discussed and seen additionally by Dr. Katrinka BlazingSmith.  I personally spoke with Dr. Dione BoozeGroat (ophthalmology on-call) who recommended the below:  1. Vigamox 1 drop q1-2 hours overnight (needs to wake up to do this) 2. D/c all contact lens wear including right eye 3. Okay to follow up first thing in the morning with Dr. Katrina StackGoat - pt to call for appointment.  This was discussed with the patient and >50% of this 50 minute visit was spent in direct patient care and coordination of care.

## 2014-07-07 NOTE — Progress Notes (Signed)
History and physical examinations obtained with Dr. Hazle Quantigby.  Agree with assessment and plan.

## 2016-02-15 ENCOUNTER — Ambulatory Visit (INDEPENDENT_AMBULATORY_CARE_PROVIDER_SITE_OTHER): Payer: BC Managed Care – PPO | Admitting: Physician Assistant

## 2016-02-15 ENCOUNTER — Ambulatory Visit (INDEPENDENT_AMBULATORY_CARE_PROVIDER_SITE_OTHER): Payer: BC Managed Care – PPO

## 2016-02-15 VITALS — BP 144/90 | HR 81 | Temp 97.6°F | Resp 16 | Ht 68.0 in | Wt 189.0 lb

## 2016-02-15 DIAGNOSIS — S90222A Contusion of left lesser toe(s) with damage to nail, initial encounter: Secondary | ICD-10-CM | POA: Diagnosis not present

## 2016-02-15 DIAGNOSIS — M79675 Pain in left toe(s): Secondary | ICD-10-CM | POA: Diagnosis not present

## 2016-02-15 NOTE — Patient Instructions (Addendum)
Because you received an x-ray today, you will receive an invoice from [facility]. Please contact [facility] at [phone] with questions or concerns regarding your invoice. Our billing staff will not be able to assist you with those questions.   RICE for Routine Care of Injuries Theroutine careofmanyinjuriesincludes rest, ice, compression, and elevation (RICE therapy). RICE therapy is often recommended for injuries to soft tissues, such as a muscle strain, ligament injuries, bruises, and overuse injuries. It can also be used for some bony injuries. Using RICE therapy can help to relieve pain, lessen swelling, and enable your body to heal. Rest Rest is required to allow your body to heal. This usually involves reducing your normal activities and avoiding use of the injured part of your body. Generally, you can return to your normal activities when you are comfortable and have been given permission by your health care provider. Ice Icing your injury helps to keep the swelling down, and it lessens pain. Do not apply ice directly to your skin.  Put ice in a plastic bag.  Place a towel between your skin and the bag.  Leave the ice on for 20 minutes, 2-3 times a day. Do this for as long as you are directed by your health care provider. Compression Compression means putting pressure on the injured area. Compression helps to keep swelling down, gives support, and helps with discomfort. Compression may be done with an elastic bandage. If an elastic bandage has been applied, follow these general tips:  Remove and reapply the bandage every 3-4 hours or as directed by your health care provider.  Make sure the bandage is not wrapped too tightly, because this can cut off circulation. If part of your body beyond the bandage becomes blue, numb, cold, swollen, or more painful, your bandage is most likely too tight. If this occurs, remove your bandage and reapply it more  loosely.  See your health care provider if the bandage seems to be making your problems worse rather than better. Elevation Elevation means keeping the injured area raised. This helps to lessen swelling and decrease pain. If possible, your injured area should be elevated at or above the level of your heart or the center of your chest. WHEN SHOULD I SEEK MEDICAL CARE? You should seek medical care if:  Your pain and swelling continue.  Your symptoms are getting worse rather than improving. These symptoms may indicate that further evaluation or further X-rays are needed. Sometimes, X-rays may not show a small broken bone (fracture) until a number of days later. Make a follow-up appointment with your health care provider. WHEN SHOULD I SEEK IMMEDIATE MEDICAL CARE? You should seek immediate medical care if:  You have sudden severe pain at or below the area of your injury.  You have redness or increased swelling around your injury.  You have tingling or numbness at or below the area of your injury that does not improve after you remove the elastic bandage.   This information is not intended to replace advice given to you by your health care provider. Make sure you discuss any questions you have with your health care provider.   Document Released: 03/11/2001 Document Revised: 08/18/2015 Document Reviewed: 11/04/2014 Elsevier Interactive Patient Education [employer]2016 Elsevier Inc.

## 2016-02-15 NOTE — Progress Notes (Signed)
[facility] [address], [place] [address] [phone]  Date:  02/15/2016   Name:  Daniel NovasChristopher Gill   DOB:  [birth year]   MRN:  [id number]  PCP:  No PCP Per Patient    Chief Complaint: Foot Injury   History of Present Illness:  This is a 25 y.o. male who is presenting with left great toe injury that occurred yesterday. He was moving a table when it dropped on his toe. He is having tingling and throbbing pain. There is blood collected under his toenail. He has not tried anything for his pain.  He works as a Electrical engineersecurity guard.  Review of Systems:  Review of Systems See HPI  Patient Active Problem List   Diagnosis Date Noted  . Bacterial keratitis 07/02/2014    Prior to Admission medications   Not on File    Allergies  Allergen Reactions  . Latex Swelling  . Sulfa Drugs Cross Reactors Hives and Swelling    History reviewed. No pertinent past surgical history.  Social History  Substance Use Topics  . Smoking status: Never Smoker   . Smokeless tobacco: Never Used  . Alcohol Use: No    History reviewed. No pertinent family history.  Medication list has been reviewed and updated.  Physical Examination:  Physical Exam  Constitutional: He is oriented to person, place, and time. He appears well-developed and well-nourished. No distress.  HENT:  Head: Normocephalic and atraumatic.  Right Ear: Hearing normal.  Left Ear: Hearing normal.  Nose: Nose normal.  Eyes: Conjunctivae and lids are normal. Right eye exhibits no discharge. Left eye exhibits no discharge. No scleral icterus.  Cardiovascular: Normal rate, regular rhythm and normal pulses.   Pulmonary/Chest: Effort normal. No respiratory distress.  Musculoskeletal: Normal range of motion.       Right foot: Normal.       Left foot: There is tenderness (at distal great toe, mild at IP joint). There is normal range of motion.  Subungual hematoma present, proximal 1/3 toenail  Neurological:  He is alert and oriented to person, place, and time.  Skin: Skin is warm, dry and intact. No lesion and no rash noted.  Psychiatric: He has a normal mood and affect. His speech is normal and behavior is normal. Thought content normal.   BP 144/90 mmHg  Pulse 81  Temp(Src) 97.6 F (36.4 C) (Oral)  Resp 16  Ht 5\' 8"  (1.727 m)  Wt 189 lb (85.73 kg)  BMI 28.74 kg/m2  SpO2 98% Dg Toe Great Left  02/15/2016  CLINICAL DATA:  Injury. EXAM: LEFT GREAT TOE COMPARISON:  No prior. FINDINGS: No acute bony or joint abnormality identified. No evidence of fracture or dislocation. IMPRESSION: No acute abnormality. Electronically Signed   By: Maisie Fushomas  Register   On: 02/15/2016 09:35    Procedure: Verbal consent obtained. Nail cleaned with alcohol swab. Electric cautery used to make small hole in nail. Blood evacuated. Dressing placed.  Assessment and Plan:  1. Subungual hematoma of toe of left foot, initial encounter 2. Toe pain, left Radiograph negative. Likely pain d/t contusion and subungual hematoma. Evacuated by electric cautery. Return as needed.  - DG Toe Great Left; Future   Roswell MinersNicole V. Dyke BrackettBush, PA-C, MHS [facility]  02/15/2016

## 2016-05-11 DIAGNOSIS — I441 Atrioventricular block, second degree: Secondary | ICD-10-CM

## 2016-05-11 HISTORY — DX: Atrioventricular block, second degree: I44.1

## 2016-09-29 ENCOUNTER — Encounter: Payer: Self-pay | Admitting: Physician Assistant

## 2016-09-29 ENCOUNTER — Ambulatory Visit (INDEPENDENT_AMBULATORY_CARE_PROVIDER_SITE_OTHER): Payer: BC Managed Care – PPO | Admitting: Physician Assistant

## 2016-09-29 VITALS — BP 122/80 | HR 80 | Temp 98.3°F | Resp 18 | Ht 68.0 in | Wt 205.0 lb

## 2016-09-29 DIAGNOSIS — R9431 Abnormal electrocardiogram [ECG] [EKG]: Secondary | ICD-10-CM | POA: Diagnosis not present

## 2016-09-29 DIAGNOSIS — J45990 Exercise induced bronchospasm: Secondary | ICD-10-CM

## 2016-09-29 NOTE — Progress Notes (Signed)
Daniel Gill Kinkade  MRN: [id number] DOB: June 05, 1991  Subjective:  Daniel Gill Harlan is a 25 y.o. male seen in office today for a chief complaint of follow up on heart issues. He is in [employer]  In 05/2016, he started having exertional chest pain, SOB, syncope, radiating left arm pain when he would work out. [place] [facility] and his EKG showed 2nd degree AV block. He was informed to stop exercising. [place] [facility], has had monthly EKGs all showing 2nd degree AV block at different fast med or urgent cares. Each time he went, he was told that he was stable and to continue to not exercise. He has not seen a cardiologist for this issue yet. He needs an EKG for his unit to show that he is still stable.   He was also diagnosed with exercise induced bronchospasm. He has been taking 50mg  prednisone anywhere from daily to twice  a week to once a month for three months for bronchospasms. He also got put on advair during this time. He uses the advair twice a week.   Pt has a history of cold induced asthma but never used an inhaler for this. He has been very active his whole life and has never had heart issues until 05/2016. He has been in the [employer] for two years.   Pt is adopted and does not know his family history.   Review of Systems  Constitutional: Negative for diaphoresis, fatigue and fever.  Respiratory: Negative for cough and shortness of breath.   Cardiovascular: Negative for chest pain, palpitations and leg swelling.  Gastrointestinal: Negative for abdominal pain, diarrhea, nausea and vomiting.  Neurological: Negative for dizziness, weakness, light-headedness and headaches.   Patient Active Problem List   Diagnosis Date Noted  . Bacterial keratitis 07/02/2014    No current outpatient prescriptions on file prior to visit.   No current facility-administered medications on file prior to visit.     Allergies  Allergen Reactions  . Latex  Swelling  . Sulfa Drugs Cross Reactors Hives and Swelling   Social History   Social History  . Marital status: Single    Spouse name: N/A  . Number of children: N/A  . Years of education: N/A   Occupational History  . Not on file.   Social History Main Topics  . Smoking status: Never Smoker  . Smokeless tobacco: Never Used  . Alcohol use No  . Drug use: No  . Sexual activity: Not on file   Other Topics Concern  . Not on file   Social History Narrative  . No narrative on file   Past Medical History:  Diagnosis Date  . Allergy   . Sickle cell trait (HCC)     Objective:  BP 122/80 (BP Location: Right Arm, Patient Position: Sitting, Cuff Size: Small)   Pulse 80   Temp 98.3 F (36.8 C) (Oral)   Resp 18   Ht 5\' 8"  (1.727 m)   Wt 205 lb (93 kg)   SpO2 98%   BMI 31.17 kg/m   Physical Exam  Constitutional: He is oriented to person, place, and time and well-developed, well-nourished, and in no distress.  HENT:  Head: Normocephalic and atraumatic.  Eyes: Conjunctivae are normal.  Neck: Normal range of motion.  Cardiovascular: Normal rate, regular rhythm, normal heart sounds and intact distal pulses.   Pulmonary/Chest: Effort normal and breath sounds normal. He has no wheezes. He has no  rales.  Lymphadenopathy:       Head (right side): No submental, no submandibular, no tonsillar, no preauricular, no posterior auricular and no occipital adenopathy present.       Head (left side): No submental, no submandibular, no tonsillar, no preauricular, no posterior auricular and no occipital adenopathy present.    He has no cervical adenopathy.       Right cervical: No deep cervical adenopathy present.      Right: No supraclavicular adenopathy present.       Left: No supraclavicular adenopathy present.  Neurological: He is alert and oriented to person, place, and time. Gait normal.  Skin: Skin is warm and dry.  Psychiatric: Affect normal.  Vitals reviewed.   EKG shows sinus  rhythm at 73 bpm with nonspecific T wave inversion in lead III. No acute findings. EKG was presented to Dr. Clelia Croft.   Assessment and Plan :  1. Abnormal EKG - EKG 12-Lead - Ambulatory referral to Cardiology  2. Exercise-induced asthma - Ambulatory referral to Pulmonology  -Return to clinic if symptoms worsen, do not improve, or as needed  Benjiman Core PA-C  [facility] 09/29/2016 1:57 PM

## 2016-09-29 NOTE — Patient Instructions (Addendum)
  Follow up with cardiology referral, it should be next week. Please let me know if you do not hear back from them.   You will also be contacted for pulmonology appointment.   If you develop sudden chest pain, SOB, dizziness, or syncope seek care immediately.   IF you received an x-ray today, you will receive an invoice from [facility]. Please contact [facility] at [phone] with questions or concerns regarding your invoice.   IF you received labwork today, you will receive an invoice from [facility]. Please contact [facility] at [phone] with questions or concerns regarding your invoice.   Our billing staff will not be able to assist you with questions regarding bills from these companies.  You will be contacted with the lab results as soon as they are available. The fastest way to get your results is to activate your My Chart account. Instructions are located on the last page of this paperwork. If you have not heard from us regarding the results in 2 weeks, please contact this office.

## 2016-10-12 ENCOUNTER — Ambulatory Visit: Payer: Self-pay | Admitting: Cardiology

## 2016-10-20 ENCOUNTER — Ambulatory Visit (INDEPENDENT_AMBULATORY_CARE_PROVIDER_SITE_OTHER): Payer: BC Managed Care – PPO | Admitting: Physician Assistant

## 2016-10-20 VITALS — BP 120/82 | HR 68 | Temp 98.1°F | Resp 18 | Ht 68.0 in | Wt 205.0 lb

## 2016-10-20 DIAGNOSIS — B9789 Other viral agents as the cause of diseases classified elsewhere: Secondary | ICD-10-CM

## 2016-10-20 DIAGNOSIS — J069 Acute upper respiratory infection, unspecified: Secondary | ICD-10-CM | POA: Diagnosis not present

## 2016-10-20 MED ORDER — AZELASTINE HCL 0.15 % NA SOLN: 2.0000 | Freq: Two times a day (BID) | NASAL | 0 refills | Status: DC

## 2016-10-20 MED ORDER — GUAIFENESIN ER 1200 MG PO TB12: 1.0000 | ORAL_TABLET | Freq: Two times a day (BID) | ORAL | 1 refills | Status: DC | PRN

## 2016-10-20 NOTE — Progress Notes (Signed)
Subjective:    Patient ID: Daniel NovasChristopher Kloepfer, male    DOB: 09-01-1991, 25 y.o.   MRN: [id number]  Chief Complaint  Patient presents with  . Headache  . Nasal Congestion  . Emesis   HPI: Presents for nasal congestion, sore throat, eye itching/redness, and crusting on the lashes which began 4 days ago. Associated symptoms include sinus pressure, chest tightness, headaches, and subjective fevers as well as some mild fatigue. History of environmental allergies. Has not taken any medications at home for this illness. Patient denies cough, chest pain, chills, ear pain or fullness, tinnitus, SOB, wheezing, PND, nausea. Notes a few episodes of emesis today which he believes is from the drainage from the congestion. Decreased appetite today. Denies abdominal pain or nausea. Denies constipation or diarrhea. Sick contacts include his fiance whom recently had a cold.  Has not received a flu vaccine, states he usually gets it with work but has not received it yet.  Review of Systems  Constitutional: Positive for fatigue. Negative for chills.  HENT: Positive for congestion, postnasal drip, rhinorrhea, sinus pain, sinus pressure and sore throat. Negative for ear discharge and ear pain.   Eyes: Positive for pain, discharge, redness and itching.  Respiratory: Positive for chest tightness. Negative for cough, shortness of breath, wheezing and stridor.   Cardiovascular: Negative for chest pain and palpitations.  Gastrointestinal: Positive for vomiting. Negative for abdominal pain, constipation, diarrhea and nausea.  Genitourinary: Negative for dysuria, frequency, hematuria and urgency.  Musculoskeletal: Negative for neck pain and neck stiffness.  Neurological: Positive for light-headedness and headaches. Negative for dizziness and syncope.   Allergies  Allergen Reactions  . Latex Swelling  . Sulfa Drugs Cross Reactors Hives and Swelling   Prior to Admission medications   Medication Sig Start Date End  Date Taking? Authorizing Provider  fluticasone-salmeterol (ADVAIR HFA) 115-21 MCG/ACT inhaler Inhale 2 puffs into the lungs 2 (two) times daily.    Historical Provider, MD  naproxen (NAPROSYN) 500 MG tablet Take 500 mg by mouth 2 (two) times daily with a meal.    Historical Provider, MD   Patient Active Problem List   Diagnosis Date Noted  . Bacterial keratitis 07/02/2014      Objective: Blood pressure 120/82, pulse 68, temperature 98.1 F (36.7 C), temperature source Oral, resp. rate 18, height 5\' 8"  (1.727 m), weight 205 lb (93 kg), SpO2 98 %.   Physical Exam  Constitutional: He is oriented to person, place, and time. He appears well-developed and well-nourished. No distress.  HENT:  Head: Normocephalic and atraumatic. Head is without right periorbital erythema and without left periorbital erythema.  Right Ear: External ear normal. No drainage, swelling or tenderness. No mastoid tenderness. Tympanic membrane is not injected, not scarred, not perforated, not erythematous, not retracted and not bulging. No middle ear effusion. No hemotympanum.  Left Ear: External ear normal. No drainage, swelling or tenderness.  No middle ear effusion.  Nose: Mucosal edema and rhinorrhea present. No sinus tenderness, septal deviation or nasal septal hematoma. No epistaxis. Right sinus exhibits no maxillary sinus tenderness and no frontal sinus tenderness. Left sinus exhibits no maxillary sinus tenderness and no frontal sinus tenderness.  Mouth/Throat: No oropharyngeal exudate.  Left ear with cerumen impaction, unable to visualize TM.  Eyes: Conjunctivae are normal. Pupils are equal, round, and reactive to light. Right eye exhibits no discharge and no exudate. Left eye exhibits no discharge and no exudate. Right conjunctiva is not injected. Right conjunctiva has no hemorrhage. Left conjunctiva is  not injected. Left conjunctiva has no hemorrhage. No scleral icterus. Right pupil is round and reactive. Left pupil is  round and reactive. Pupils are equal.  Neck: Normal range of motion. Neck supple. No tracheal deviation, no edema and no erythema present. No thyromegaly present.  Cardiovascular: Normal rate, regular rhythm, normal heart sounds and intact distal pulses.  Exam reveals no gallop and no friction rub.   No murmur heard. Pulses:      Radial pulses are 2+ on the right side, and 2+ on the left side.  Pulmonary/Chest: Effort normal and breath sounds normal. No accessory muscle usage or stridor. No respiratory distress. He has no decreased breath sounds. He has no wheezes. He has no rhonchi. He has no rales.  Lymphadenopathy:       Head (right side): No submental, no submandibular, no tonsillar, no preauricular, no posterior auricular and no occipital adenopathy present.       Head (left side): No submental, no submandibular, no tonsillar, no preauricular, no posterior auricular and no occipital adenopathy present.    He has no cervical adenopathy.  Neurological: He is alert and oriented to person, place, and time.  Skin: Skin is warm and dry. No rash noted. He is not diaphoretic. No cyanosis. Nails show no clubbing.  Psychiatric: He has a normal mood and affect. His behavior is normal.        Assessment & Plan:  1. Viral URI Recommended rest and increased fluids over the next few days. Rx Azelastine HCl nasal spray and Mucinex. Advised patient to RTC if symptoms are worsening or not improving in the next few days as it could represent progression to a bacterial infection. - Azelastine HCl 0.15 % SOLN; Place 2 sprays into both nostrils 2 (two) times daily.  Dispense: 30 mL; Refill: 0 - Guaifenesin (MUCINEX MAXIMUM STRENGTH) 1200 MG TB12; Take 1 tablet (1,200 mg total) by mouth every 12 (twelve) hours as needed.  Dispense: 14 tablet; Refill: 1

## 2016-10-20 NOTE — Progress Notes (Signed)
Patient ID: Daniel NovasChristopher Brackney, male    DOB: Jul 01, 1991, 25 y.o.   MRN: [id number]  PCP: No PCP Per Patient  Chief Complaint  Patient presents with  . Headache  . Nasal Congestion  . Emesis    Subjective:    HPI Presents for evaluation of 4 days of illness.  He relates sore throat, drainage, itchy red eyes, sinus pressure and chest tightness, fatigue and subjective fever. Some headaches, some crusting on the lashes.   No cough. No OTC medications tried. No chills, ear pain, SOB, wheezing. He has had some nausea and vomiting today, thought due to the mucous drainage.  No flu vaccine yet this season..    Review of Systems Constitutional: Positive for fatigue. Negative for chills.  HENT: Positive for congestion, postnasal drip, rhinorrhea, sinus pain, sinus pressure and sore throat. Negative for ear discharge and ear pain.   Eyes: Positive for pain, discharge, redness and itching.  Respiratory: Positive for chest tightness. Negative for cough, shortness of breath, wheezing and stridor.   Cardiovascular: Negative for chest pain and palpitations.  Gastrointestinal: Positive for vomiting. Negative for abdominal pain, constipation, diarrhea and nausea.  Genitourinary: Negative for dysuria, frequency, hematuria and urgency.  Musculoskeletal: Negative for neck pain and neck stiffness.  Neurological: Positive for light-headedness and headaches. Negative for dizziness and syncope.     Patient Active Problem List   Diagnosis Date Noted  . Bacterial keratitis 07/02/2014     Prior to Admission medications   Medication Sig Start Date End Date Taking? Authorizing Provider  fluticasone-salmeterol (ADVAIR HFA) 115-21 MCG/ACT inhaler Inhale 2 puffs into the lungs 2 (two) times daily.    Historical Provider, MD  naproxen (NAPROSYN) 500 MG tablet Take 500 mg by mouth 2 (two) times daily with a meal.    Historical Provider, MD     Allergies  Allergen Reactions  . Latex Swelling    . Sulfa Drugs Cross Reactors Hives and Swelling       Objective:  Physical Exam  Constitutional: He is oriented to person, place, and time. He appears well-developed and well-nourished. No distress.  BP 120/82 (BP Location: Right Arm, Patient Position: Sitting, Cuff Size: Small)   Pulse 68   Temp 98.1 F (36.7 C) (Oral)   Resp 18   Ht 5\' 8"  (1.727 m)   Wt 205 lb (93 kg)   SpO2 98%   BMI 31.17 kg/m    HENT:  Head: Normocephalic and atraumatic.  Right Ear: Hearing, tympanic membrane, external ear and ear canal normal.  Left Ear: Hearing, tympanic membrane, external ear and ear canal normal.  Nose: Mucosal edema (bluish hue) and rhinorrhea present.  No foreign bodies. Right sinus exhibits no maxillary sinus tenderness and no frontal sinus tenderness. Left sinus exhibits no maxillary sinus tenderness and no frontal sinus tenderness.  Mouth/Throat: Uvula is midline, oropharynx is clear and moist and mucous membranes are normal. No uvula swelling. No oropharyngeal exudate.  Sniffling repeatedly during interview and exam  Eyes: Conjunctivae and EOM are normal. Pupils are equal, round, and reactive to light. Right eye exhibits no discharge. Left eye exhibits no discharge. No scleral icterus.  Neck: Trachea normal, normal range of motion and full passive range of motion without pain. Neck supple. No thyroid mass and no thyromegaly present.  Cardiovascular: Normal rate, regular rhythm and normal heart sounds.   Pulmonary/Chest: Effort normal and breath sounds normal.  Lymphadenopathy:       Head (right side): No submandibular, no tonsillar, no  preauricular, no posterior auricular and no occipital adenopathy present.       Head (left side): No submandibular, no tonsillar, no preauricular and no occipital adenopathy present.    He has no cervical adenopathy.       Right: No supraclavicular adenopathy present.       Left: No supraclavicular adenopathy present.  Neurological: He is alert and  oriented to person, place, and time. He has normal strength. No cranial nerve deficit or sensory deficit.  Skin: Skin is warm, dry and intact. No rash noted.  Psychiatric: He has a normal mood and affect. His speech is normal and behavior is normal.           Assessment & Plan:   1. Viral URI Supportive care.  Anticipatory guidance.  RTC if symptoms worsen/persist. - Azelastine HCl 0.15 % SOLN; Place 2 sprays into both nostrils 2 (two) times daily.  Dispense: 30 mL; Refill: 0 - Guaifenesin (MUCINEX MAXIMUM STRENGTH) 1200 MG TB12; Take 1 tablet (1,200 mg total) by mouth every 12 (twelve) hours as needed.  Dispense: 14 tablet; Refill: 1   Fernande Brashelle S. Bernadette Armijo, PA-C Physician Assistant-Certified [facility]

## 2016-10-20 NOTE — Patient Instructions (Addendum)
Get plenty of rest and drink at least 64 ounces of water daily.     IF you received an x-ray today, you will receive an invoice from [facility]. Please contact [facility] at [phone] with questions or concerns regarding your invoice.   IF you received labwork today, you will receive an invoice from [facility]. Please contact [facility] at [phone] with questions or concerns regarding your invoice.   Our billing staff will not be able to assist you with questions regarding bills from these companies.  You will be contacted with the lab results as soon as they are available. The fastest way to get your results is to activate your My Chart account. Instructions are located on the last page of this paperwork. If you have not heard from us regarding the results in 2 weeks, please contact this office.     

## 2016-10-26 ENCOUNTER — Ambulatory Visit (INDEPENDENT_AMBULATORY_CARE_PROVIDER_SITE_OTHER): Payer: BC Managed Care – PPO | Admitting: Cardiology

## 2016-10-26 ENCOUNTER — Encounter: Payer: Self-pay | Admitting: Cardiology

## 2016-10-26 DIAGNOSIS — R011 Cardiac murmur, unspecified: Secondary | ICD-10-CM | POA: Diagnosis not present

## 2016-10-26 DIAGNOSIS — R55 Syncope and collapse: Secondary | ICD-10-CM | POA: Diagnosis not present

## 2016-10-26 DIAGNOSIS — R0609 Other forms of dyspnea: Secondary | ICD-10-CM

## 2016-10-26 DIAGNOSIS — R05 Cough: Secondary | ICD-10-CM | POA: Insufficient documentation

## 2016-10-26 DIAGNOSIS — R079 Chest pain, unspecified: Secondary | ICD-10-CM | POA: Diagnosis not present

## 2016-10-26 DIAGNOSIS — R054 Cough syncope: Secondary | ICD-10-CM | POA: Insufficient documentation

## 2016-10-26 NOTE — Progress Notes (Signed)
PCP: Magdalene RiverBrittany D Wiseman, PA-C  Clinic Note: Chief Complaint  Patient presents with  . New Patient (Initial Visit)    Exertional CP & SOB    HPI: Daniel Gill is a 25 y.o. male with a PMH below who presents today for cardiology consultation.  Daniel Gill was seen in urgent care on November 10 with upper respiratory tract infection symptoms. Prior to that he was seen on October 10 for evaluation of exertional chest pain and shortness of breath and syncope. Apparently he was in the [employer]. Back in June 2017 he was noticing exertional chest pain and shortness of breath and syncope with the pain radiating to his left arm during workouts. [facility] he was shown to be in first-degree AV block. Told to stop exercising. He had EKGs continued to show first-degree AV block. (2nd Deg)  He was diagnosed with exercise-induced bronchospasm in the past as well.  Recent Hospitalizations: None, but [facility]  Studies Reviewed: Not available.  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 It began this summer when he was on a training exercise down to June. He was on a run with his units and he became profoundly dyspneic and had central chest pressure. He actually passed out during an episode. He was evaluated and was found to be hypertensive, and possibly had an abnormal EKG. When I asked him what it was, he can only he was told he had first degree block, however I see notes of second-degree block. He did not remember anything about the significant EKG change. He has really been limiting his activity of late because of this discomfort and shortness breath that he has. It usually. Starts off as a shortness of breath and then becomes a tightness in his chest. It is more of a sensation  of not being up to catch his breath and heaviness sensation from that. It usually is relieved with rest, and is not all associated with rest alone. Always exertional. He denies any other symptoms of PND, orthopnea or edema to suggest this is heart failure. No rapid irregular heartbeats or palpitations. Besides the one syncopal event that he had this summer when he was having a hard time breathing, he is not had any syncope/near syncope or TIA/amaurosis fugax symptoms.   ROS: A comprehensive was performed. Review of Systems  Constitutional: Negative for chills, fever and malaise/fatigue.  HENT: Negative.   Eyes: Negative.   Respiratory: Positive for shortness of breath and wheezing (He does have some wheezing. Was diagnosed with possible exercise-induced bronchospasm). Negative for cough.        Per history of present illness  Cardiovascular: Positive for chest pain.       Per history of present illness  Gastrointestinal: Negative.   Genitourinary: Negative.   Musculoskeletal: Negative.   Skin: Negative.   Neurological: Positive for loss of consciousness (None since the summer).  Psychiatric/Behavioral: Negative.   All other systems reviewed and are negative.   Past Medical History:  Diagnosis Date  . Allergy   . Asthma   . Second degree AV block 05/2016  . Sickle cell trait (HCC)     History reviewed. No pertinent surgical history.  Current Meds  Medication Sig  . Azelastine HCl 0.15 % SOLN Place 2 sprays into both  nostrils 2 (two) times daily.  . Guaifenesin (MUCINEX MAXIMUM STRENGTH) 1200 MG TB12 Take 1 tablet (1,200 mg total) by mouth every 12 (twelve) hours as needed.  . naproxen (NAPROSYN) 500 MG tablet Take 500 mg by mouth 2 (two) times daily with a meal.  . [DISCONTINUED] fluticasone-salmeterol (ADVAIR HFA) 115-21 MCG/ACT inhaler Inhale 2 puffs into the lungs 2 (two) times daily.   Allergies  Allergen Reactions  . Latex Swelling  . Sulfa Drugs Cross Reactors Hives and  Swelling    Social History   Social History  . Marital status: Single    Spouse name: N/A  . Number of children: N/A  . Years of education: N/A   Social History Main Topics  . Smoking status: Never Smoker  . Smokeless tobacco: Never Used  . Alcohol use No  . Drug use: No  . Sexual activity: No   Other Topics Concern  . None   Social History Narrative  . None   Family History  Problem Relation Age of Onset  . Adopted: Yes  . Heart attack Father 60    Wt Readings from Last 3 Encounters:  10/26/16 94.3 kg (208 lb)  10/20/16 93 kg (205 lb)  09/29/16 93 kg (205 lb)    PHYSICAL EXAM BP 129/81   Pulse 72   Ht 5\' 7"  (1.702 m)   Wt 94.3 kg (208 lb)   BMI 32.58 kg/m  General appearance: alert, cooperative, appears stated age, no distress and Well-nourished, well-groomed. His BMI indicates 32.5 however he is not obese by his body habitus. HEENT: [place]/AT, EOMI, MMM, anicteric sclera Neck: no adenopathy, no carotid bruit and no JVD Lungs: clear to auscultation bilaterally, normal percussion bilaterally and non-labored Heart: regular rate and rhythm, S1& S2 normal, no click, rub or gallop; nondisplaced PMI. He does have a soft systolic murmur heard at the left sternal border (it does sound more ejection murmur than a holosystolic murmur however cannot really tell based on his) Abdomen: soft, non-tender; bowel sounds normal; no masses,  no organomegaly; no HJR Extremities: extremities normal, atraumatic, no cyanosis, or  edema Pulses: 2+ and symmetric;  Skin: normal and temperature normal Neurologic: Mental status: Alert, oriented, thought content appropriate Cranial nerves: normal (II-XII grossly intact)    Adult ECG Report  Rate: 72 ;  Rhythm: normal sinus rhythm and 1 AVB. Otherwise 1 axis, intervals and durations. Normal voltage ;   Narrative Interpretation:normal EKG   Other studies Reviewed: Additional studies/ records that were reviewed today include:  Recent  Labs:  Not available     ASSESSMENT / PLAN: Problem List Items Addressed This Visit    Dyspnea on exertion    I agree that exercise-induced asthma is a very likely etiology for symptoms, however with a systolic murmur, and such early onset of symptoms, we need to exclude a potential cardiac etiology. We will check 2-D echocardiogram as well as a stress echo.  If these 2 evaluations are not indicative of cardiac etiology, I think then a pulmonary evaluation would be warranted      Relevant Orders   EKG 12-Lead   ECHOCARDIOGRAM COMPLETE   ECHOCARDIOGRAM STRESS TEST   Chest pain with moderate risk for cardiac etiology    . She is a very young man who is otherwise healthy to have this type of symptom. Features that he doesn't have any other risk factors besides his birth father having heart disease in his 62s or 14s. However the description of tightness interpret pressure  in his chest with exertion is very concerning. 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   Plan: Full 2-D echocardiogram followed by stress echocardiogram      Relevant Orders   EKG 12-Lead   ECHOCARDIOGRAM COMPLETE   ECHOCARDIOGRAM STRESS TEST   Systolic murmur    Young man with concerning symptoms who also has a murmur on exam. Need to exclude hypertrophic cardiomyopathy.   Plan: Check 2-D echocardiogram.  Will also do stress echocardiogram to exclude potential exertional induced valvular disease.      Relevant Orders   EKG 12-Lead   ECHOCARDIOGRAM COMPLETE   ECHOCARDIOGRAM STRESS TEST   Cardiac related syncope    He had an episode of syncope this summer that was exertional in nature associated with dyspnea. Heart without what was, but I would like to evaluate him with exertion. I do want to get stress images because this could be related to a stress  related structural normality. I would like for him to meet full target heart rate and exercise until he is not able to go any further. This may require him to be allowed to run on the treadmill portion of the stress echo.      Relevant Orders   EKG 12-Lead   ECHOCARDIOGRAM COMPLETE   ECHOCARDIOGRAM STRESS TEST      Current medicines are reviewed at length with the patient today. (+/- concerns) none The following changes have been made:  Patient Instructions  Schedule [address] DO FIRST--Your physician has requested that you have an echocardiogram. Echocardiography is a painless test that uses sound waves to create images of your heart. It provides your doctor with information about the size and shape of your heart and how well your heart's chambers and valves are working. This procedure takes approximately one hour. There are no restrictions for this procedure.  DO [facility] DAYYour physician has requested that you have a stress echocardiogram. For further information please visit [email or web address]. Please follow instruction sheet as given.    NO OTHER CHANGES TREATMENT   Your physician recommends that you schedule a follow-up appointment in: 2 MONTHS WITH DR Casten Floren.   Studies Ordered:   Orders Placed This Encounter  Procedures  . EKG 12-Lead  . ECHOCARDIOGRAM COMPLETE  . ECHOCARDIOGRAM STRESS TEST      Bryan Lemmaavid Tanis Hensarling, M.D., M.S. Interventional Cardiologist   Pager # [phone] Phone # [phone] [address] [place], [place] [address]

## 2016-10-26 NOTE — Patient Instructions (Signed)
Schedule [address] DO FIRST--Your physician has requested that you have an echocardiogram. Echocardiography is a painless test that uses sound waves to create images of your heart. It provides your doctor with information about the size and shape of your heart and how well your heart's chambers and valves are working. This procedure takes approximately one hour. There are no restrictions for this procedure.  DO [facility] DAYYour physician has requested that you have a stress echocardiogram. For further information please visit [email or web address]. Please follow instruction sheet as given.    NO OTHER CHANGES TREATMENT   Your physician recommends that you schedule a follow-up appointment in: 2 MONTHS WITH DR HARDING.

## 2016-10-28 ENCOUNTER — Encounter: Payer: Self-pay | Admitting: Cardiology

## 2016-10-28 NOTE — Assessment & Plan Note (Signed)
I agree that exercise-induced asthma is a very likely etiology for symptoms, however with a systolic murmur, and such early onset of symptoms, we need to exclude a potential cardiac etiology. We will check 2-D echocardiogram as well as a stress echo.  If these 2 evaluations are not indicative of cardiac etiology, I think then a pulmonary evaluation would be warranted

## 2016-10-28 NOTE — Assessment & Plan Note (Signed)
He had an episode of syncope this summer that was exertional in nature associated with dyspnea. Heart without what was, but I would like to evaluate him with exertion. I do want to get stress images because this could be related to a stress related structural normality. I would like for him to meet full target heart rate and exercise until he is not able to go any further. This may require him to be allowed to run on the treadmill portion of the stress echo.

## 2016-10-28 NOTE — Assessment & Plan Note (Signed)
Young man with concerning symptoms who also has a murmur on exam. Need to exclude hypertrophic cardiomyopathy.   Plan: Check 2-D echocardiogram.  Will also do stress echocardiogram to exclude potential exertional induced valvular disease.

## 2016-10-28 NOTE — Assessment & Plan Note (Signed)
.   She is a very young man who is otherwise healthy to have this type of symptom. Features that he doesn't have any other risk factors besides his birth father having heart disease in his 750s or 1060s. However the description of tightness interpret pressure in his chest with exertion is very concerning. 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   Plan: Full 2-D echocardiogram followed by stress echocardiogram

## 2016-10-30 ENCOUNTER — Telehealth: Payer: Self-pay | Admitting: Cardiology

## 2016-10-30 NOTE — Telephone Encounter (Signed)
Left message for patient to call.  He has been scheduled for a stress echo.

## 2016-11-30 ENCOUNTER — Other Ambulatory Visit (HOSPITAL_COMMUNITY): Payer: BC Managed Care – PPO

## 2016-12-11 HISTORY — PX: TRANSTHORACIC ECHOCARDIOGRAM: SHX275

## 2016-12-14 ENCOUNTER — Other Ambulatory Visit (HOSPITAL_COMMUNITY): Payer: BC Managed Care – PPO

## 2016-12-14 ENCOUNTER — Telehealth (HOSPITAL_COMMUNITY): Payer: Self-pay | Admitting: *Deleted

## 2016-12-14 NOTE — Telephone Encounter (Signed)
Left message on voicemail per DPR in reference to upcoming appointment scheduled on 12/21/16 at 7:30 with detailed instructions given per Stress Test Requisition Sheet for the test. LM to arrive 30 minutes early, and that it is imperative to arrive on time for appointment to keep from having the test rescheduled. If you need to cancel or reschedule your appointment, please call the office within 24 hours of your appointment. Failure to do so may result in a cancellation of your appointment, and a $50 no show fee. Phone number given for call back for any questions. Daneil DolinSharon S Brooks

## 2016-12-21 ENCOUNTER — Other Ambulatory Visit (HOSPITAL_COMMUNITY): Payer: BC Managed Care – PPO

## 2016-12-29 ENCOUNTER — Telehealth (HOSPITAL_COMMUNITY): Payer: Self-pay | Admitting: *Deleted

## 2016-12-29 NOTE — Telephone Encounter (Signed)
Instructions given to patient for upcoming stress echo on 01/03/17.  Daniel Gill, [name]

## 2017-01-02 ENCOUNTER — Ambulatory Visit: Payer: BC Managed Care – PPO | Admitting: Cardiology

## 2017-01-03 ENCOUNTER — Encounter (INDEPENDENT_AMBULATORY_CARE_PROVIDER_SITE_OTHER): Payer: Self-pay

## 2017-01-03 ENCOUNTER — Ambulatory Visit (HOSPITAL_COMMUNITY): Attending: Cardiology

## 2017-01-03 ENCOUNTER — Ambulatory Visit (HOSPITAL_BASED_OUTPATIENT_CLINIC_OR_DEPARTMENT_OTHER)

## 2017-01-03 ENCOUNTER — Other Ambulatory Visit: Payer: Self-pay

## 2017-01-03 ENCOUNTER — Encounter: Payer: Self-pay | Admitting: *Deleted

## 2017-01-03 ENCOUNTER — Ambulatory Visit (HOSPITAL_COMMUNITY)

## 2017-01-03 DIAGNOSIS — R011 Cardiac murmur, unspecified: Secondary | ICD-10-CM

## 2017-01-03 DIAGNOSIS — I4510 Unspecified right bundle-branch block: Secondary | ICD-10-CM | POA: Insufficient documentation

## 2017-01-03 DIAGNOSIS — R55 Syncope and collapse: Secondary | ICD-10-CM | POA: Diagnosis not present

## 2017-01-03 DIAGNOSIS — R0609 Other forms of dyspnea: Secondary | ICD-10-CM | POA: Insufficient documentation

## 2017-01-03 DIAGNOSIS — I10 Essential (primary) hypertension: Secondary | ICD-10-CM | POA: Insufficient documentation

## 2017-01-03 DIAGNOSIS — R079 Chest pain, unspecified: Secondary | ICD-10-CM

## 2017-01-03 HISTORY — PX: OTHER SURGICAL HISTORY: SHX169

## 2017-01-04 NOTE — Progress Notes (Signed)
Echo results: Good news: Essentially normal echocardiogram and normal pump function and normal valve function.EF: 55-60%. No regional wall motion abnormalities =   No signs to suggest heart attack  Amiel Sharrow, MD    

## 2017-01-29 IMAGING — CR DG TOE GREAT 2+V*L*
1 series · 1 of 1 positions shown · non-contrast
Comparison: No prior.

CLINICAL DATA: Injury.

EXAM:
LEFT GREAT TOE

[AP]
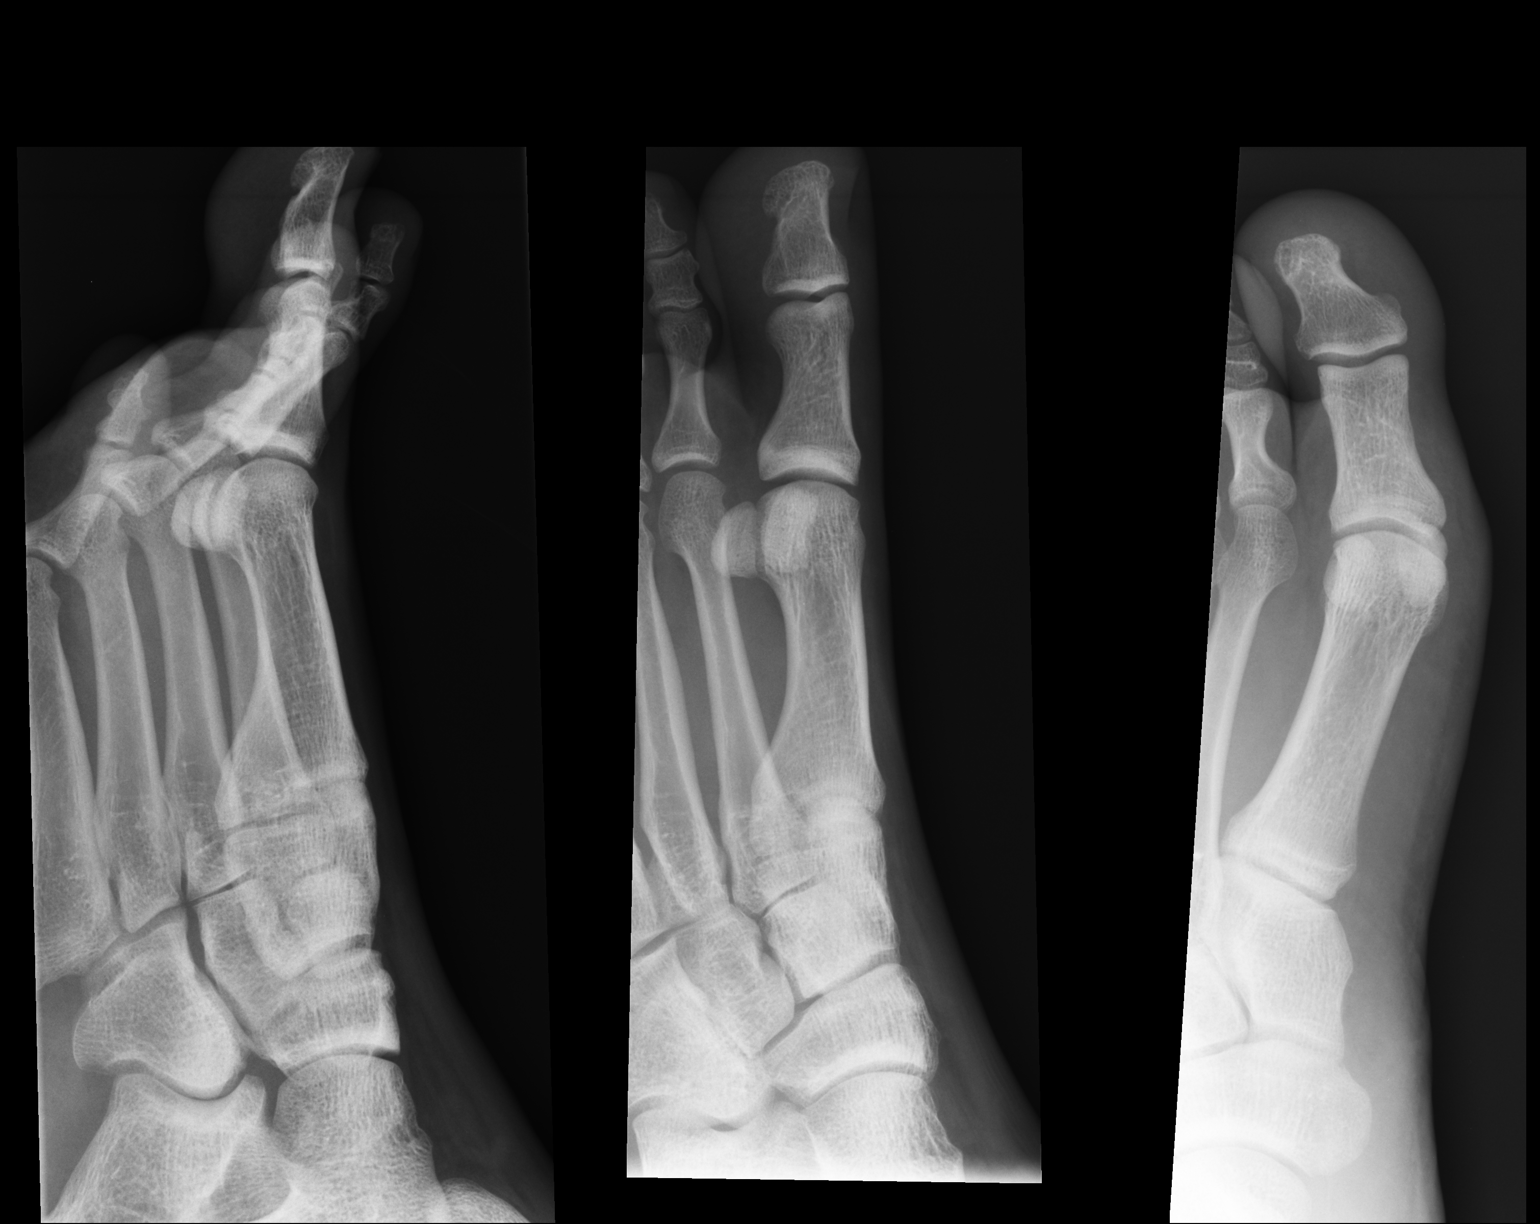

[1 of 1 positions shown; findings below may reference images not displayed]

FINDINGS: No acute bony or joint abnormality identified. No evidence of
fracture or dislocation.
IMPRESSION: No acute abnormality.

## 2017-02-02 ENCOUNTER — Telehealth: Payer: Self-pay | Admitting: Cardiology

## 2017-02-02 NOTE — Telephone Encounter (Signed)
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    Patient due for follow up with Dr. Clarita CraneHarding-appt made for 3/12 at 8:45 AM-f/u test and advised he would be able to discuss with Dr. Herbie BaltimoreHarding at appt and get letter if needed.  Patient agreed and verbalized understanding.  Patient request letter with appointment time and date for proof of future appointment that he can provide to reserves.  Patient will pick up Monday.  Letter created and placed at front desk.

## 2017-02-02 NOTE — Telephone Encounter (Signed)
New message   Pt verbalized that he is calling for rn to give him some   information on weather or not he can be cleared for service

## 2017-02-19 ENCOUNTER — Ambulatory Visit (INDEPENDENT_AMBULATORY_CARE_PROVIDER_SITE_OTHER): Admitting: Cardiology

## 2017-02-19 ENCOUNTER — Encounter: Payer: Self-pay | Admitting: Cardiology

## 2017-02-19 VITALS — BP 110/78 | HR 76 | Ht 69.0 in | Wt 211.0 lb

## 2017-02-19 DIAGNOSIS — R0609 Other forms of dyspnea: Secondary | ICD-10-CM | POA: Diagnosis not present

## 2017-02-19 DIAGNOSIS — R011 Cardiac murmur, unspecified: Secondary | ICD-10-CM

## 2017-02-19 DIAGNOSIS — R55 Syncope and collapse: Secondary | ICD-10-CM | POA: Diagnosis not present

## 2017-02-19 DIAGNOSIS — R079 Chest pain, unspecified: Secondary | ICD-10-CM

## 2017-02-19 NOTE — Progress Notes (Signed)
PCP: Leonie Douglas, PA-C  Clinic Note: Chief Complaint  Patient presents with  . Follow-up    follow up from ECHO, no chest pain; needs work note  . Loss of Consciousness    Summer 2017  . Shortness of Breath    Exertional dyspnea    HPI: Daniel Gill is a 26 y.o. male with a PMH below who presents today for follow-up after initial evaluation in November 2017 or exertional dyspnea.  Daniel Gill was last seen on 10/26/2016 after urgent care visit earlier that month. He is reserve Estate manager/land agent. He has been noticing exertional chest pain dyspnea as well as potential syncope for several months. He was told that he had first-degree AV block on EKG and told to stop exercising because of potential secondary AV block. I never saw any EKGs with secondary block.  Recent Hospitalizations: None  Studies Reviewed:  Updated and past surgical history  2-D echo 01/03/2017: Normal echo with normal EF 55-60%. No regional wall motion abnormality. No significant valvular lesions with only trivial mitral regurgitation.  Stress Echo 01/03/2017: Excellent exercise tolerance achieving 17.2 METs.  No evidence of ischemia or infarction. LOW RISK. No worsening valvular disease. Baseline hypertension with hypertensive response to exercise blood pressure up to 217/82 mmHg  Interval History: He presents today overall feeling pretty well. He has been trying to be more active, but continues to have been restricted until follow-up appointment. He has not had any further syncopal episodes. No significant exertional dyspnea. He did well and was stress test. He denied any headaches or blurred vision or dizziness. No PND, orthopnea or edema. No palpitations, lightheadedness, dizziness, weakness or syncope/near syncope. He also notes that it gets cold (or excessively hot) outside he may have some of the wheezing with excess, but not usually with is not cold. He hasn't really had any further episodes of  wheezing recently. He also denies any further episodes of syncope. No TIA/amaurosis fugax symptoms. No claudication.  ROS: A comprehensive was performed. Review of Systems  Constitutional: Negative for malaise/fatigue.  HENT: Negative for congestion.   Respiratory: Positive for wheezing (Usually associated with exertion in either extremes cold or hot). Negative for cough, sputum production and shortness of breath.   Cardiovascular:       Negative per history of present illness  Gastrointestinal: Negative for blood in stool and melena.  All other systems reviewed and are negative.   Past Medical History:  Diagnosis Date  . Allergy   . Asthma   . Second degree AV block 05/2016  . Sickle cell trait [facility])     Past Surgical History:  Procedure Laterality Date  . TRANSTHORACIC ECHOCARDIOGRAM  12/2016    Normal echo with normal EF 55-60%. No regional wall motion abnormality. No significant valvular lesions with only trivial mitral regurgitation.  . TREADMILL STRESS ECHO  01/03/2017   Excellent exercise tolerance achieving 17.2 METs.  No evidence of ischemia or infarction. LOW RISK. No worsening valvular disease. Baseline hypertension with hypertensive response to exercise blood pressure up to 217/82 mmHg    Current Meds  Medication Sig  . Azelastine HCl 0.15 % SOLN Place 2 sprays into both nostrils 2 (two) times daily.  . Guaifenesin (MUCINEX MAXIMUM STRENGTH) 1200 MG TB12 Take 1 tablet (1,200 mg total) by mouth every 12 (twelve) hours as needed.  . naproxen (NAPROSYN) 500 MG tablet Take 500 mg by mouth 2 (two) times daily with a meal.    Allergies  Allergen Reactions  .  Latex Swelling  . Sulfa Drugs Cross Reactors Hives and Swelling    Social History   Social History  . Marital status: Single    Spouse name: N/A  . Number of children: N/A  . Years of education: N/A   Social History Main Topics  . Smoking status: Never Smoker  . Smokeless tobacco: Never Used  . Alcohol  use No  . Drug use: No  . Sexual activity: No   Other Topics Concern  . 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   He is also doing part-time college at [employer]   He is currently engaged but soon to be married. The couple has 1 child on the way.   Does not smoke, never smoked. He has a rare alcohol beverage.   He has not been exercising recently because of his symptoms and because he was told not to until he had a cardiac evaluation.    family history includes Heart attack (age of onset: 60) in his father. He was adopted.  Wt Readings from Last 3 Encounters:  02/19/17 95.7 kg (211 lb)  10/26/16 94.3 kg (208 lb)  10/20/16 93 kg (205 lb)    PHYSICAL EXAM BP 110/78   Pulse 76   Ht '5\' 9"'$  (1.753 m)   Wt 95.7 kg (211 lb)   BMI 31.16 kg/m  General appearance: alert, cooperative, appears stated age, no distress and Well-nourished, well-groomed. His BMI indicates 32.5 however he is not obese by his body habitus. HEENT: [place]/AT, EOMI, MMM, anicteric sclera Neck: no adenopathy, no carotid bruit and no JVD Lungs: clear to auscultation bilaterally, normal percussion bilaterally and non-labored Heart: regular rate and rhythm, A1& S2 normal, no click, rub or gallop; nondisplaced PMI. He does have a soft systolic murmur heard at the left sternal border (it does sound more ejection murmur than a holosystolic murmur however cannot really tell based on his) Neurologic: Mental status: Alert, oriented, thought content appropriate    Adult ECG Report  Other studies Reviewed: Additional studies/ records that were reviewed today include:  Recent Labs:  -    ASSESSMENT / PLAN: Daniel Gill returns today follow-up after studies and is doing quite well. His cardiac evaluation to date has been relatively benign. EKG is essentially normal and his stress test with echocardiogram both stress  and rest echocardiogram look normal. Nothing to suspect any structural cardiac abnormality or ischemia. No sign of hypertrophic obstructive cardiomyopathy. No symptoms to suggest arrhythmias. As such, my recommendation is that he should be elevated return to full active exercise duty. He may benefit from evaluation by pulmonary medicine to determine if he potentially does have some exercise-induced bronchospasm.  He should be released from profile and be allowed to exercise fully.  Problem List Items Addressed This Visit    Cardiac related syncope    I don't think that his syncopal episodes and evening with an arrhythmia, they just did not sound arrhythmogenic in nature. He had no structural abnormality noted on echocardiogram and no signs of ischemia on stress echocardiogram. He met target heart rate without any difficulty and had no significant PACs. I suspect he may have been somewhat dehydrated when this occurred, but I could not be sure. In the absence of any symptoms of arrhythmia, I don't think there is any reason to do any further evaluation unless his further episodes. In which case  we may want to consider a loop recorder.      Chest pain with minimal risk for cardiac etiology    Young man otherwise healthy. He does have a family history of premature coronary disease, but he is quite young for that still. He had negative dress echocardiogram with no structural abnormalities. Maybe mild hypertensive response but this was extremely vigorous exercise. He had no symptoms with it and he did not have diastolic hypertension which is more concerning. At that level exertion, no evidence of ischemia suggested any chest pain is not cardiac in nature.      Dyspnea on exertion - Primary    With no structural other abnormality cardiac standpoint to suggest hypertrophic cardio myopathy or valvular disease, and negative stress test artery against coronary disease, I think exertional dyspnea is probably  related to subpleural pulmonary issues. He may benefit from pulmonary evaluation with exercise stimulation PFTs. I would not continue cardiac evaluation at this time.      Systolic murmur    With no valvular disease on echocardiogram, probably related to innocent flow murmur in a young gentleman that is benign in nature. No signs of hypertrophic cardiomyopathy or valvular disease.         Current medicines are reviewed at length with the patient today. (+/- concerns) None The following changes have been made:  none  Patient Instructions  No change to treatment    CLEARED  FOR PHYSICAL ACTIVITY  A letter will be written you may pick up or retrieve from [facility]    Your physician recommends that you schedule a follow-up appointment on an as needed basis.    Studies Ordered:   No orders of the defined types were placed in this encounter.     Glenetta Hew, M.D., M.S. Interventional Cardiologist   Pager # [phone] Phone # [phone] [address] [place], [place] [address]

## 2017-02-19 NOTE — Patient Instructions (Signed)
No change to treatment    CLEARED  FOR PHYSICAL ACTIVITY  A [facility] will be written [facility]    Your physician recommends that you schedule a follow-up appointment on an as needed basis.

## 2017-02-21 ENCOUNTER — Encounter: Payer: Self-pay | Admitting: Cardiology

## 2017-02-21 NOTE — Assessment & Plan Note (Signed)
I don't think that his syncopal episodes and evening with an arrhythmia, they just did not sound arrhythmogenic in nature. He had no structural abnormality noted on echocardiogram and no signs of ischemia on stress echocardiogram. He met target heart rate without any difficulty and had no significant PACs. I suspect he may have been somewhat dehydrated when this occurred, but I could not be sure. In the absence of any symptoms of arrhythmia, I don't think there is any reason to do any further evaluation unless his further episodes. In which case we may want to consider a loop recorder.

## 2017-02-21 NOTE — Assessment & Plan Note (Signed)
With no valvular disease on echocardiogram, probably related to innocent flow murmur in a young gentleman that is benign in nature. No signs of hypertrophic cardiomyopathy or valvular disease.

## 2017-02-21 NOTE — Assessment & Plan Note (Signed)
Young man otherwise healthy. He does have a family history of premature coronary disease, but he is quite young for that still. He had negative dress echocardiogram with no structural abnormalities. Maybe mild hypertensive response but this was extremely vigorous exercise. He had no symptoms with it and he did not have diastolic hypertension which is more concerning. At that level exertion, no evidence of ischemia suggested any chest pain is not cardiac in nature.

## 2017-02-21 NOTE — Assessment & Plan Note (Signed)
With no structural other abnormality cardiac standpoint to suggest hypertrophic cardio myopathy or valvular disease, and negative stress test artery against coronary disease, I think exertional dyspnea is probably related to subpleural pulmonary issues. He may benefit from pulmonary evaluation with exercise stimulation PFTs. I would not continue cardiac evaluation at this time.

## 2017-07-12 ENCOUNTER — Ambulatory Visit (INDEPENDENT_AMBULATORY_CARE_PROVIDER_SITE_OTHER): Admitting: Cardiology

## 2017-07-12 ENCOUNTER — Telehealth: Payer: Self-pay | Admitting: Cardiology

## 2017-07-12 ENCOUNTER — Encounter: Payer: Self-pay | Admitting: Cardiology

## 2017-07-12 VITALS — BP 118/90 | HR 69 | Ht 69.0 in | Wt 211.8 lb

## 2017-07-12 DIAGNOSIS — R05 Cough: Secondary | ICD-10-CM | POA: Diagnosis not present

## 2017-07-12 DIAGNOSIS — J45990 Exercise induced bronchospasm: Secondary | ICD-10-CM | POA: Diagnosis not present

## 2017-07-12 DIAGNOSIS — R079 Chest pain, unspecified: Secondary | ICD-10-CM | POA: Diagnosis not present

## 2017-07-12 DIAGNOSIS — R0609 Other forms of dyspnea: Secondary | ICD-10-CM | POA: Diagnosis not present

## 2017-07-12 DIAGNOSIS — R55 Syncope and collapse: Secondary | ICD-10-CM

## 2017-07-12 NOTE — Telephone Encounter (Signed)
Pt was seen today and was to call Jasmine DecemberSharon back with a fax number to get records- [phone]-he does not know who to send it too

## 2017-07-12 NOTE — Progress Notes (Signed)
PCP: Magdalene RiverWiseman, Brittany D, PA-C  Clinic Note: Chief Complaint  Patient presents with  . Follow-up    History of syncope; presumptive diagnosis of pericarditis    HPI: Daniel Gill is a 26 y.o. male who is being seen today for re-evaluation of near syncope, exertional dyspnea & ? Recent bout of pericarditits. at the request of Magdalene RiverWiseman, Brittany D, [place]*. Daniel Gill was last seen on 10/26/2016 after urgent care visit earlier that month. He is [employer]. He has been noticing exertional chest pain dyspnea as well as potential syncope for several months. He was told that he had first-degree AV block on EKG and told to stop exercising because of potential secondary AV block. I never saw any EKGs with secondary block.  Daniel Gill was last seen in March 2018 ->  This is a follow-up of a combination of a 2-D echo as well a stress echo. Both were essentially totally normal (see below).  These results would argue against an ischemic cardiac etiology for exertional dyspnea & I was not able to identify any heart block on an EKG.  He had not had any recurrent syncope & we may never know why he passed out.  Recent Hospitalizations: [facility] as a result of a syncopal episode that occurred during his PFT run.     Studies Personally Reviewed - (if available, images/films reviewed: From Epic Chart or Care Everywhere)  No new studies.  Interval History: Daniel Gill returns today for re-evaluation after a recent episode that occurred during his most recent PFT test.   He did fine with the pushups, pull-ups, sit ups portion of the test. However,during the running portion, he made about halfway before he had to stop b/c he became profoundly short of breath with a tight squeezing sensation across his chest -- he simply could not breath & was wheezing.  He tried to control his breathing, but started to se stars & got nauseated before passing out.  [facility] & was given IVF infusion along with IV steroids for what was thought to be an asthma attack.  His breathing improved dramatically after he was given Solu-Medrol by the paramedics before getting to the ER. Once they gave him this, his chest discomfort and breathing improved significantly. He was then discharged with a short course of steroids and a month taper of Naprosyn for what was presumed to be a diagnosis of pericarditis. He did not indicate that he had an echocardiogram done, but this diagnosis was made based on EKG. Baseline he had not had any antecedent viral syndrome type symptoms such as coughing, fevers or chills or nausea/vomiting. The only anomaly had was when he blacked out. He didn't recall feeling any sensation of rapid irregular heartbeats or palpitations besides the fact his heart rate was going fast because he was running.  He had been doing his PT training during his current drill. Without any difficulty. He had not had any chest tightness or discomfort. No syncope or near syncope. No palpitations. Had noted a sensation of difficulty breathing during more vigorous running, but not the extent that he had during this test. He is not had any PND orthopnea or edema. With get routine activities here back at home he is not had any syncopal symptoms like this. He is not had any seizures or headache/blurred blurred vision. Essentially within one exception, he is not had any lightheadedness or dizziness or syncope/near syncope.  No TIA or murmurs fugax symptoms. No chest discomforts before after this episode. He denied any positional chest discomfort that may have led to the diagnosis of pericarditis.  The concerning thing for him now is that he is also scheduled to see a pulmonologist. He basically is concerned that as a result of this episode he is probably going to be medically discharged versus simply discharged from reserve [employer]	[place].  ROS: A comprehensive  was performed. Most notable symptoms noted above. Otherwise negative. Review of Systems  Constitutional: Negative for chills, fever and malaise/fatigue.  HENT: Negative for congestion.   Respiratory:       Per history of present illness  Gastrointestinal: Negative for blood in stool and melena.  Genitourinary: Negative for dysuria and hematuria.  Musculoskeletal: Negative.   Neurological: Positive for loss of consciousness (Noted in history of present illness). Negative for dizziness and weakness.  Psychiatric/Behavioral: Negative.   All other systems reviewed and are negative.  I have reviewed and (if needed) personally updated the patient's problem list, medications, allergies, past medical and surgical history, social and family history.   Past Medical History:  Diagnosis Date  . Allergy   . Asthma    Most likely exercise induced  . Second degree AV block 05/2016  . Sickle cell trait [facility])     Past Surgical History:  Procedure Laterality Date  . TRANSTHORACIC ECHOCARDIOGRAM  12/2016    Normal echo with normal EF 55-60%. No regional wall motion abnormality. No significant valvular lesions with only trivial mitral regurgitation.  . TREADMILL STRESS ECHO  01/03/2017   Excellent exercise tolerance achieving 17.2 METs.  No evidence of ischemia or infarction. LOW RISK. No worsening valvular disease. Baseline hypertension with hypertensive response to exercise blood pressure up to 217/82 mmHg    Current Meds  Medication Sig  . Azelastine HCl 0.15 % SOLN Place 2 sprays into both nostrils 2 (two) times daily.  . Guaifenesin (MUCINEX MAXIMUM STRENGTH) 1200 MG TB12 Take 1 tablet (1,200 mg total) by mouth every 12 (twelve) hours as needed.  . naproxen (NAPROSYN) 500 MG tablet Take 500 mg by mouth 2 (two) times daily with a meal.    Allergies  Allergen Reactions  . Latex Swelling  . Sulfa Drugs Cross Reactors Hives and Swelling    Social History   Social History  . Marital  status: Single    Spouse name: N/A  . Number of children: N/A  . Years of education: N/A   Social History Main Topics  . Smoking status: Never Smoker  . Smokeless tobacco: Never Used  . Alcohol use No  . Drug use: No  . Sexual activity: No   Other Topics Concern  . 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   He is also doing part-time college at [employer]   He is currently engaged but soon to be married. The couple has 1 child on the way.   Does not smoke, never smoked. He has a rare alcohol beverage.   He has not been exercising recently because of his symptoms and because he was told not to until he had a cardiac evaluation.    family history includes Heart attack (age of onset: 27) in his father. He was adopted.  Wt Readings from Last 3 Encounters:  07/12/17 211 lb 12.8 oz (96.1 kg)  02/19/17 211 lb (95.7 kg)  10/26/16 208 lb (94.3 kg)    PHYSICAL EXAM BP 118/90   Pulse 69   Ht 5\' 9"  (1.753 m)   Wt 211 lb 12.8 oz (96.1 kg)   BMI 31.28 kg/m  Physical Exam  Constitutional: He is oriented to person, place, and time. He appears well-developed and well-nourished. No distress.  Neck: No JVD present.  Cardiovascular: Normal rate, regular rhythm, normal heart sounds and intact distal pulses.  Exam reveals no gallop and no friction rub.   No murmur heard. Nondisplaced PMI. No HJR  Pulmonary/Chest: Effort normal and breath sounds normal. No respiratory distress. He has no wheezes. He has no rales.  Abdominal: Soft. Bowel sounds are normal. He exhibits no distension. There is no tenderness. There is no rebound.  Musculoskeletal: Normal range of motion. He exhibits no edema.  Neurological: He is alert and oriented to person, place, and time.  Skin: Skin is warm and dry. No rash noted. No erythema.  Psychiatric: He has a normal mood and affect. His behavior is normal.  Judgment and thought content normal.  Nursing note and vitals reviewed.    Adult ECG Report  Rate: 69 ;  Rhythm: normal sinus rhythm and Incomplete RBBB. Otherwise normal intervals, axis, durations. Normal voltage.;   Narrative Interpretation: Stable, relatively normal EKG   Other studies Reviewed: Additional studies/ records that were reviewed today include:  Recent Labs:  N/a - hospital labs not available   ASSESSMENT / PLAN: Problem List Items Addressed This Visit    Chest pain with minimal risk for cardiac etiology    The chest discomfort/tightness to the noted during this most recent episode can easily be explained by asthma. He was given a diagnosis of pericarditis and was treated with Naprosyn. This was clearly a clinical diagnosis which based on the symptoms he describes seems to be hard to substantiate. Regardless, pericarditis is a transient episode with low likelihood of recurrence after full treatment.  Unless he had exam findings of pericardial rub or diffuse ST elevations on EKG, I do not have an updated to understand what the diagnosis came from. He is completing course of Naprosyn, and after completing his treatment, this would not be a chronic cardiac condition. He has no rub on exam today, and no abnormal findings on EKG to suggest pericarditis.      Cough syncope    The most recent episode of syncope probably was related to hyperventilation and hypoxia from asthma exacerbation. The fact that his symptoms notably improved with treatment for asthma using Solu-Medrol and steroid taper would argue that the main etiology for his episode was related to a pulmonary etiology. There were no symptoms to suggest an arrhythmia and nothing to truly are you for possible vasovagal syncope. This is more related to hyperventilation.      Dyspnea on exertion    This episode that he describes in the treatment involved his probably consistent with exercise-induced asthma. He had no  evidence of structural abnormality on echocardiogram. Nothing such as hypertrophic cardiomyopathy or any significant valvular disease. Negative stress test. Clearly, he exerts himself more vigorously. PFT run that he does on a GXT, however he had no sensation of chest tightness pressure during other runs. There is no reason to believe that this episode is related to an anginal equivalent.  The fact that his symptoms improved dramatically with treatment for asthma would argue that this is exercise-induced asthma.  In my medical opinion he does not need any additional ischemic cardiac evaluation (  which would potentially be an angiogram either CT angiogram or invasive angiogram) unless this would be required as part of his military evaluation.      Relevant Orders   EKG 12-Lead (Completed)   Exercise-induced asthma - Primary    I am not a pulmonologist, and would defer to a pulmonologist's evaluation, however an otherwise healthy young man with a negative stress test and essentially normal echocardiogram who has an episode of profound dyspnea with wheezing leading to hyperventilation syncope has many criteria for diagnosing exercise-induced asthma. Especially, since he was given a diagnosis of asthma and treated with steroids and breathing treatments.  The question becomes is exercise-induced asthma reason for medical discharge in the [employer] [place].         Current medicines are reviewed at length with the patient today. (+/- concerns) n/a The following changes have been made: n/a  Patient Instructions  NO CHANGES AT PRESENT TIME    AWAITING FOR PATIENT TO GIVE FAX NUMBER AND CONTACT PERSON FROM UNIT TO SEND INFORMATION     Your physician recommends that you schedule a follow-up appointment ON AS NEED BASIS.     Studies Ordered:   Orders Placed This Encounter  Procedures  . EKG 12-Lead      Bryan Lemmaavid Raul Torrance, M.D., M.S. Interventional Cardiologist   Pager #  [phone] Phone # [phone] [address] [place], [place] [id number]

## 2017-07-12 NOTE — Patient Instructions (Signed)
NO CHANGES AT PRESENT TIME    AWAITING FOR PATIENT TO GIVE FAX NUMBER AND CONTACT PERSON FROM UNIT TO SEND INFORMATION     Your physician recommends that you schedule a follow-up appointment ON AS NEED BASIS.

## 2017-07-14 ENCOUNTER — Encounter: Payer: Self-pay | Admitting: Cardiology

## 2017-07-14 DIAGNOSIS — J45990 Exercise induced bronchospasm: Secondary | ICD-10-CM | POA: Insufficient documentation

## 2017-07-14 NOTE — Assessment & Plan Note (Signed)
This episode that he describes in the treatment involved his probably consistent with exercise-induced asthma. He had no evidence of structural abnormality on echocardiogram. Nothing such as hypertrophic cardiomyopathy or any significant valvular disease. Negative stress test. Clearly, he exerts himself more vigorously. PFT run that he does on a GXT, however he had no sensation of chest tightness pressure during other runs. There is no reason to believe that this episode is related to an anginal equivalent.  The fact that his symptoms improved dramatically with treatment for asthma would argue that this is exercise-induced asthma.  In my medical opinion he does not need any additional ischemic cardiac evaluation (which would potentially be an angiogram either CT angiogram or invasive angiogram) unless this would be required as part of his military evaluation.

## 2017-07-14 NOTE — Assessment & Plan Note (Signed)
The chest discomfort/tightness to the noted during this most recent episode can easily be explained by asthma. He was given a diagnosis of pericarditis and was treated with Naprosyn. This was clearly a clinical diagnosis which based on the symptoms he describes seems to be hard to substantiate. Regardless, pericarditis is a transient episode with low likelihood of recurrence after full treatment.  Unless he had exam findings of pericardial rub or diffuse ST elevations on EKG, I do not have an updated to understand what the diagnosis came from. He is completing course of Naprosyn, and after completing his treatment, this would not be a chronic cardiac condition. He has no rub on exam today, and no abnormal findings on EKG to suggest pericarditis.

## 2017-07-14 NOTE — Assessment & Plan Note (Signed)
The most recent episode of syncope probably was related to hyperventilation and hypoxia from asthma exacerbation. The fact that his symptoms notably improved with treatment for asthma using Solu-Medrol and steroid taper would argue that the main etiology for his episode was related to a pulmonary etiology. There were no symptoms to suggest an arrhythmia and nothing to truly are you for possible vasovagal syncope. This is more related to hyperventilation.

## 2017-07-14 NOTE — Assessment & Plan Note (Signed)
I am not a pulmonologist, and would defer to a pulmonologist's evaluation, however an otherwise healthy young man with a negative stress test and essentially normal echocardiogram who has an episode of profound dyspnea with wheezing leading to hyperventilation syncope has many criteria for diagnosing exercise-induced asthma. Especially, since he was given a diagnosis of asthma and treated with steroids and breathing treatments.  The question becomes is exercise-induced asthma reason for medical discharge in the [employer] [place].

## 2017-07-18 NOTE — Telephone Encounter (Signed)
Left message  --- that clinic note was faxed to fax number given . Any question may call back

## 2018-12-25 ENCOUNTER — Other Ambulatory Visit: Payer: Self-pay

## 2018-12-25 ENCOUNTER — Encounter: Payer: Self-pay | Admitting: Internal Medicine

## 2018-12-25 ENCOUNTER — Ambulatory Visit: Payer: BLUE CROSS/BLUE SHIELD | Admitting: Internal Medicine

## 2018-12-25 VITALS — BP 122/66 | HR 73 | Temp 97.7°F | Ht 68.2 in | Wt 213.8 lb

## 2018-12-25 DIAGNOSIS — F988 Other specified behavioral and emotional disorders with onset usually occurring in childhood and adolescence: Secondary | ICD-10-CM | POA: Diagnosis not present

## 2018-12-25 DIAGNOSIS — Z0283 Encounter for blood-alcohol and blood-drug test: Secondary | ICD-10-CM

## 2018-12-25 MED ORDER — LISDEXAMFETAMINE DIMESYLATE 30 MG PO CAPS: 30.0000 mg | ORAL_CAPSULE | Freq: Every day | ORAL | 0 refills | Status: DC

## 2018-12-25 NOTE — Progress Notes (Signed)
Subjective:     Patient ID: Daniel Gill , male    DOB: 1991-05-21 , 28 y.o.   MRN: [id number]   Chief Complaint  Patient presents with  . New Patient (Initial Visit)    HPI Pt is here to establish care with our practice.  Wants to restart adhd medication had to stop due to joining [employer] currently in school and feels that he needs it. Has had diagnosis of ADHD in 2010. Had an evaluation by a psychologist at [facility] in [place]. And was diagnosed with ADHD. Has been on Vyvance x 1 year, then joined the [employer] and had to be off it. Got discharged due to heart murmur and pericarditis. He used to ran during this years in the [employer] and on the 4th year while running he faitned but was told it was dehydration. Later on saw a private cardiologist who did stress test and echo and only had mild murmur and was cleared to go back to running. I reviewed his echo from 12/2016 and was normal EF 55-60% and no significant vabular disease. His stress test was normal. He is going back to school and would like to get back on medication. Has tolerated the Vyvanse fine in the past.  He is working out and working on loosing wt.  Past Medical History:  Diagnosis Date  . Allergy   . Asthma    Most likely exercise induced  . Heart murmur   . Second degree AV block 05/2016  . Sickle cell trait (HCC)      Family History  Adopted: Yes  Problem Relation Age of Onset  . Heart attack Father 48     Current Outpatient Medications:  .  Azelastine HCl 0.15 % SOLN, Place 2 sprays into both nostrils 2 (two) times daily. (Patient not taking: Reported on 12/25/2018), Disp: 30 mL, Rfl: 0 .  Guaifenesin (MUCINEX MAXIMUM STRENGTH) 1200 MG TB12, Take 1 tablet (1,200 mg total) by mouth every 12 (twelve) hours as needed. (Patient not taking: Reported on 12/25/2018), Disp: 14 tablet, Rfl: 1 .  naproxen (NAPROSYN) 500 MG tablet, Take 500 mg by mouth 2 (two) times daily with a meal., Disp: , Rfl:     Allergies  Allergen Reactions  . Latex Swelling  . Sulfa Drugs Cross Reactors Hives and Swelling     Review of Systems  Constitutional: Negative for appetite change and unexpected weight change.  HENT: Negative.   Eyes: Negative.   Respiratory: Negative for chest tightness, shortness of breath and wheezing.   Cardiovascular: Negative for chest pain, palpitations and leg swelling.  Skin: Negative for rash.  Neurological: Negative for dizziness and headaches.  Hematological: Negative for adenopathy.  Psychiatric/Behavioral:       + ADHD     Today's Vitals   12/25/18 1043  BP: 122/66  Pulse: 73  Temp: 97.7 F (36.5 C)  TempSrc: Oral  SpO2: 97%  Weight: 213 lb 12.8 oz (97 kg)  Height: 5' 8.2" (1.732 m)   Body mass index is 32.32 kg/m.   Objective:  Physical Exam Vitals signs and nursing note reviewed.  Constitutional:      General: He is not in acute distress.    Appearance: Normal appearance. He is not toxic-appearing.  HENT:     Right Ear: External ear normal.     Left Ear: External ear normal.     Nose: Nose normal.  Eyes:     General: No scleral icterus.    Conjunctiva/sclera:  Conjunctivae normal.  Neck:     Musculoskeletal: Neck supple. No neck rigidity or muscular tenderness.  Cardiovascular:     Rate and Rhythm: Normal rate and regular rhythm.     Heart sounds: No murmur.  Pulmonary:     Effort: Pulmonary effort is normal.     Breath sounds: Normal breath sounds.  Musculoskeletal: Normal range of motion.  Lymphadenopathy:     Cervical: No cervical adenopathy.  Skin:    General: Skin is warm and dry.  Neurological:     Mental Status: He is alert and oriented to person, place, and time.  Psychiatric:        Mood and Affect: Mood normal.        Behavior: Behavior normal.        Thought Content: Thought content normal.        Judgment: Judgment normal.     Assessment And Plan:     1. adhd - lisdexamfetamine (VYVANSE) 30 MG capsule; Take 1  capsule (30 mg total) by mouth daily.  Dispense: 30 capsule; Refill: 0  2. Encounter for drug screening - Drug Screen, Urine  He was explained our protocol for prescribing medication for ADHD including drug screens and he is fine with all of it. He will come back in 1 month for FU, if all is normal and pt doing fine on med, then we will see him q 90 days and he may call the week he will ran out for refills. I asked him to call us on Tuesdays so I can have it ready for him.  SYLVIA RODRIGUEZ-SOUTHWORTH, PA-C

## 2018-12-25 NOTE — Patient Instructions (Signed)
We need to see you back in 1 month and then every 90 days you need a visit.  But you may pick up Rx every 30 days in between.

## 2018-12-26 LAB — DRUG SCREEN, URINE
AMPHETAMINES, URINE: NEGATIVE ng/mL
Barbiturate screen, urine: NEGATIVE ng/mL
Benzodiazepine Quant, Ur: NEGATIVE ng/mL
CANNABINOID QUANT UR: NEGATIVE ng/mL
Cocaine (Metab.): NEGATIVE ng/mL
OPIATE QUANT UR: NEGATIVE ng/mL
PCP Quant, Ur: NEGATIVE ng/mL

## 2019-01-23 ENCOUNTER — Ambulatory Visit: Payer: BLUE CROSS/BLUE SHIELD | Admitting: Internal Medicine

## 2019-06-11 ENCOUNTER — Telehealth: Payer: Self-pay | Admitting: Internal Medicine

## 2019-06-11 NOTE — Telephone Encounter (Signed)
ATT TO CALL PT TO ADVISE OF RESCHEDULED APPT.NO ANS UNABLE TO LVM DUE TO MAILBOX FULL

## 2019-06-12 ENCOUNTER — Ambulatory Visit: Payer: BC Managed Care – PPO | Admitting: Internal Medicine

## 2019-06-26 ENCOUNTER — Ambulatory Visit: Payer: BC Managed Care – PPO | Admitting: Internal Medicine

## 2019-06-26 ENCOUNTER — Encounter: Payer: Self-pay | Admitting: Internal Medicine

## 2019-06-26 ENCOUNTER — Other Ambulatory Visit: Payer: Self-pay

## 2019-06-26 VITALS — BP 110/76 | HR 69 | Temp 98.1°F | Ht 68.2 in | Wt 220.2 lb

## 2019-06-26 DIAGNOSIS — Z9229 Personal history of other drug therapy: Secondary | ICD-10-CM | POA: Diagnosis not present

## 2019-06-26 DIAGNOSIS — F988 Other specified behavioral and emotional disorders with onset usually occurring in childhood and adolescence: Secondary | ICD-10-CM

## 2019-06-26 MED ORDER — LISDEXAMFETAMINE DIMESYLATE 30 MG PO CAPS: 30.0000 mg | ORAL_CAPSULE | Freq: Every day | ORAL | 0 refills | Status: DC

## 2019-06-26 NOTE — Progress Notes (Signed)
  Subjective:     Patient ID: Daniel Gill , male    DOB: Feb 11, 1991 , 28 y.o.   MRN: [id number]   Chief Complaint  Patient presents with  . ADHD    HPI Pt is here for refill of Vivance. Has not been taking it since he has not been going to school, but needs it refill. Has not been here since January. States he had some left and noticed that helped him stay focussed at work. Has been doing well and does not have any complaints.    Past Medical History:  Diagnosis Date  . Allergy   . Asthma    Most likely exercise induced  . Heart murmur   . Second degree AV block 05/2016  . Sickle cell trait ([place])      Family History  Adopted: Yes  Problem Relation Age of Onset  . Healthy Mother   . Heart attack Father 59     Current Outpatient Medications:  .  lisdexamfetamine (VYVANSE) 30 MG capsule, Take 1 capsule (30 mg total) by mouth daily., Disp: 30 capsule, Rfl: 0   Allergies  Allergen Reactions  . Latex Swelling  . Sulfa Drugs Cross Reactors Hives and Swelling     Review of Systems  Denies dry mouth, constipation, or other problems.  Last dose was Early May. Denies drug use of any kind.  Today's Vitals   06/26/19 1506  BP: 110/76  Pulse: 69  Temp: 98.1 F (36.7 C)  TempSrc: Oral  Weight: 220 lb 3.2 oz (99.9 kg)  Height: 5' 8.2" (1.732 m)   Body mass index is 33.29 kg/m.   Objective:  Physical Exam  Constitutional: he is oriented to person, place, and time. he appears well-developed and well-nourished. No distress.  HENT:  Head: Normocephalic and atraumatic.  Right Ear: External ear normal.  Left Ear: External ear normal.  Nose: Nose normal.  Eyes: Conjunctivae are normal. Right eye exhibits no discharge. Left eye exhibits no discharge. No scleral icterus.  Neck: Neck supple. No thyromegaly present.  No carotid bruits bilaterally  Cardiovascular: Normal rate and regular rhythm.  No murmur heard. Pulmonary/Chest: Effort normal and breath sounds normal.  No respiratory distress.  Musculoskeletal: Normal range of motion.  Lymphadenopathy: he has no cervical adenopathy.  Neurological: he is alert and oriented to person, place, and time.  Skin: Skin is warm and dry. Capillary refill takes less than 2 seconds. No rash noted. he is not diaphoretic.  Psychiatric: he has a normal mood and affect. His behavior is normal. Judgment and thought content normal.  Nursing note reviewed.     Assessment And Plan:    1. H/O high risk medication treatment- chronic.  - Drug Screen 12+Alcohol+CRT, Ur  2. adhd- chronic.  - lisdexamfetamine (VYVANSE) 30 MG capsule; Take 1 capsule (30 mg total) by mouth daily.  Dispense: 30 capsule; Refill: 0 Urine drug screen ordered.  Advised to come once a month for med refill, but face to face q 3 months.   Daniel Pieroni RODRIGUEZ-SOUTHWORTH, PA-C    THE PATIENT IS ENCOURAGED TO PRACTICE SOCIAL DISTANCING DUE TO THE COVID-19 PANDEMIC.

## 2019-06-27 ENCOUNTER — Telehealth: Payer: Self-pay

## 2019-06-27 LAB — DRUG SCREEN 12+ALCOHOL+CRT, UR
Amphetamines, Urine: NEGATIVE ng/mL
BENZODIAZ UR QL: NEGATIVE ng/mL
Barbiturate: NEGATIVE ng/mL
Cannabinoids: NEGATIVE ng/mL
Cocaine (Metabolite): NEGATIVE ng/mL
Creatinine, Urine: 294.9 mg/dL (ref 20.0–300.0)
Ethanol, Urine: NEGATIVE %
Meperidine: NEGATIVE ng/mL
Methadone: NEGATIVE ng/mL
OPIATE SCREEN URINE: NEGATIVE ng/mL
Oxycodone/Oxymorphone, Urine: NEGATIVE ng/mL
Phencyclidine: NEGATIVE ng/mL
Propoxyphene: NEGATIVE ng/mL
Tramadol: NEGATIVE ng/mL

## 2019-06-27 NOTE — Telephone Encounter (Signed)
Left message for pt to return call. Pt left message stating that the pharmacy would not fill his rx for vyvance due to credentials being wrong

## 2019-07-09 ENCOUNTER — Other Ambulatory Visit: Payer: Self-pay | Admitting: Internal Medicine

## 2019-07-09 DIAGNOSIS — Z20822 Contact with and (suspected) exposure to covid-19: Secondary | ICD-10-CM

## 2019-07-11 ENCOUNTER — Telehealth: Payer: Self-pay

## 2019-07-11 ENCOUNTER — Encounter: Payer: Self-pay | Admitting: Internal Medicine

## 2019-07-11 LAB — NOVEL CORONAVIRUS, NAA: SARS-CoV-2, NAA: DETECTED — AB

## 2019-07-11 NOTE — Telephone Encounter (Signed)
Called to check on pt. He stated that he is doing well. I advised pt to have his family tested. His wife went for testing. His son has cancer so he must go to the cancer center for testing

## 2019-07-18 ENCOUNTER — Encounter: Payer: Self-pay | Admitting: Internal Medicine

## 2019-10-02 ENCOUNTER — Ambulatory Visit: Payer: BC Managed Care – PPO | Admitting: Internal Medicine

## 2019-10-09 ENCOUNTER — Ambulatory Visit (INDEPENDENT_AMBULATORY_CARE_PROVIDER_SITE_OTHER): Payer: BC Managed Care – PPO | Admitting: Internal Medicine

## 2019-10-09 ENCOUNTER — Other Ambulatory Visit: Payer: Self-pay

## 2019-10-09 ENCOUNTER — Encounter: Payer: Self-pay | Admitting: Internal Medicine

## 2019-10-09 VITALS — BP 118/78 | HR 81 | Temp 98.1°F | Wt 220.0 lb

## 2019-10-09 DIAGNOSIS — F988 Other specified behavioral and emotional disorders with onset usually occurring in childhood and adolescence: Secondary | ICD-10-CM | POA: Diagnosis not present

## 2019-10-09 MED ORDER — LISDEXAMFETAMINE DIMESYLATE 30 MG PO CAPS: 30.0000 mg | ORAL_CAPSULE | Freq: Every day | ORAL | 0 refills | Status: AC

## 2019-10-09 NOTE — Progress Notes (Signed)
  Subjective:     Patient ID: Daniel Gill , male    DOB: 1991-06-25 , 28 y.o.   MRN: [id number]   Chief Complaint  Patient presents with  . medication follow up    HPI  Pt is here for Vivanse refill. He states he only takes it 4 days a week  on days he has class. While on it has done well in school.  Is tolerating it and denies insomnia or wt loss.   Past Medical History:  Diagnosis Date  . Allergy   . Asthma    Most likely exercise induced  . Heart murmur   . Second degree AV block 05/2016  . Sickle cell trait ([place])      Family History  Adopted: Yes  Problem Relation Age of Onset  . Healthy Mother   . Heart attack Father 41     Current Outpatient Medications:  .  lisdexamfetamine (VYVANSE) 30 MG capsule, Take 1 capsule (30 mg total) by mouth daily., Disp: 30 capsule, Rfl: 0   Allergies  Allergen Reactions  . Latex Swelling  . Sulfa Drugs Cross Reactors Hives and Swelling     Review of Systems   Denies CP, SOB, palpitations, insomnia, constipation, wt loss, fever, chills or sweats.  Today's Vitals   10/09/19 1022  BP: 118/78  Pulse: 81  Temp: 98.1 F (36.7 C)  TempSrc: Oral  Weight: 220 lb (99.8 kg)   Body mass index is 33.25 kg/m.   Objective:  Physical Exam Vitals signs and nursing note reviewed.  Constitutional:      Appearance: Normal appearance.  HENT:     Head: Normocephalic.     Right Ear: External ear normal.     Left Ear: External ear normal.     Nose: Nose normal.  Eyes:     General: No scleral icterus.    Conjunctiva/sclera: Conjunctivae normal.  Neck:     Musculoskeletal: Neck supple.  Cardiovascular:     Rate and Rhythm: Normal rate and regular rhythm.  Pulmonary:     Effort: Pulmonary effort is normal.     Breath sounds: Normal breath sounds.  Abdominal:     General: Bowel sounds are normal. There is no distension.     Palpations: Abdomen is soft.     Tenderness: There is no abdominal tenderness.  Musculoskeletal:  Normal range of motion.  Lymphadenopathy:     Cervical: No cervical adenopathy.  Skin:    General: Skin is warm and dry.     Capillary Refill: Capillary refill takes less than 2 seconds.  Neurological:     Mental Status: He is alert and oriented to person, place, and time.     Gait: Gait normal.  Psychiatric:        Mood and Affect: Mood normal.        Behavior: Behavior normal.        Thought Content: Thought content normal.        Judgment: Judgment normal.    His wt is stable.     Assessment And Plan:     1. adhd- I handed him rx for his medication which he will continue.   FU 1 month for med refill.    Daniel Hittle RODRIGUEZ-SOUTHWORTH, PA-C    THE PATIENT IS ENCOURAGED TO PRACTICE SOCIAL DISTANCING DUE TO THE COVID-19 PANDEMIC.

## 2020-01-15 ENCOUNTER — Ambulatory Visit: Payer: BC Managed Care – PPO | Admitting: Internal Medicine

## 2020-05-13 ENCOUNTER — Encounter: Payer: BC Managed Care – PPO | Admitting: Internal Medicine

## 2020-05-20 ENCOUNTER — Encounter: Payer: BC Managed Care – PPO | Admitting: Internal Medicine

## 2021-08-12 ENCOUNTER — Ambulatory Visit: Admit: 2021-08-12 | Disposition: A | Discharge status: Home / Self Care

## 2024-01-01 ENCOUNTER — Emergency Department (HOSPITAL_BASED_OUTPATIENT_CLINIC_OR_DEPARTMENT_OTHER)
Admission: EM | Admit: 2024-01-01 | Discharge: 2024-01-01 | Disposition: A | Discharge status: Home / Self Care | Payer: Medicaid Other | Attending: Emergency Medicine | Admitting: Emergency Medicine

## 2024-01-01 ENCOUNTER — Other Ambulatory Visit: Payer: Self-pay

## 2024-01-01 DIAGNOSIS — T782XXA Anaphylactic shock, unspecified, initial encounter: Secondary | ICD-10-CM | POA: Diagnosis not present

## 2024-01-01 DIAGNOSIS — Z7951 Long term (current) use of inhaled steroids: Secondary | ICD-10-CM | POA: Insufficient documentation

## 2024-01-01 DIAGNOSIS — Z91040 Latex allergy status: Secondary | ICD-10-CM | POA: Diagnosis not present

## 2024-01-01 DIAGNOSIS — J45909 Unspecified asthma, uncomplicated: Secondary | ICD-10-CM | POA: Insufficient documentation

## 2024-01-01 DIAGNOSIS — R1310 Dysphagia, unspecified: Secondary | ICD-10-CM | POA: Diagnosis present

## 2024-01-01 MED ORDER — ONDANSETRON HCL 4 MG/2ML IJ SOLN
4.0000 mg | Freq: Once | INTRAMUSCULAR | Status: AC
  Administered 2024-01-01: 4 mg via INTRAVENOUS
  Filled 2024-01-01: qty 2

## 2024-01-01 MED ORDER — PREDNISONE 20 MG PO TABS: 40.0000 mg | ORAL_TABLET | Freq: Every day | ORAL | 0 refills | Status: AC

## 2024-01-01 MED ORDER — FAMOTIDINE IN NACL 20-0.9 MG/50ML-% IV SOLN
20.0000 mg | Freq: Once | INTRAVENOUS | Status: AC
  Administered 2024-01-01: 20 mg via INTRAVENOUS
  Filled 2024-01-01: qty 50

## 2024-01-01 MED ORDER — EPINEPHRINE 0.3 MG/0.3ML IJ SOAJ: 0.3000 mg | INTRAMUSCULAR | 0 refills | Status: DC | PRN

## 2024-01-01 MED ORDER — PREDNISONE 20 MG PO TABS: 40.0000 mg | ORAL_TABLET | Freq: Every day | ORAL | 0 refills | Status: DC

## 2024-01-01 MED ORDER — EPINEPHRINE 0.3 MG/0.3ML IJ SOAJ: 0.3000 mg | INTRAMUSCULAR | 0 refills | Status: AC | PRN

## 2024-01-01 MED ORDER — DIPHENHYDRAMINE HCL 25 MG PO TABS: 25.0000 mg | ORAL_TABLET | Freq: Four times a day (QID) | ORAL | 0 refills | Status: DC | PRN

## 2024-01-01 MED ORDER — METHYLPREDNISOLONE SODIUM SUCC 125 MG IJ SOLR
125.0000 mg | Freq: Once | INTRAMUSCULAR | Status: AC
  Administered 2024-01-01: 125 mg via INTRAVENOUS
  Filled 2024-01-01: qty 2

## 2024-01-01 MED ORDER — DIPHENHYDRAMINE HCL 25 MG PO TABS: 25.0000 mg | ORAL_TABLET | Freq: Four times a day (QID) | ORAL | 0 refills | Status: AC | PRN

## 2024-01-01 NOTE — ED Triage Notes (Signed)
Per EMS, pt ate something with nuts while at work - reports trouble breathing and throat tightening.  2 epi-pens given on scene by school staff and EMS gave 25mg  benadry PO.  Upon arrival to ED pt c/o slight lump in throat. Neg wheezes or hives.  L Hand IV in place 190/100 Hr 98 97% RA

## 2024-01-01 NOTE — ED Notes (Signed)
Dr Wilkie Aye in room with pt now.

## 2024-01-01 NOTE — Discharge Instructions (Addendum)
You were seen today for concerns for an anaphylactic reaction.  You should avoid coconut and peanuts in the future.  Take prednisone for the next 5 days.  If you have recurrence of symptoms at any time, you should use your epinephrine pen and call 911.  Follow-up with allergy and immunology.

## 2024-01-01 NOTE — ED Notes (Signed)
 RN reviewed discharge instructions with pt. Pt verbalized understanding and had no further questions. VSS upon discharge.  

## 2024-01-01 NOTE — ED Notes (Signed)
Pt had allergic reaction to unknown nut.  Believes it may have been coconut or cashew.  Pt has never had a reaction to a nut before, but has had a similar reaction to shellfish.

## 2024-01-01 NOTE — ED Provider Notes (Signed)
[place] EMERGENCY DEPARTMENT AT [facility] Provider Note   CSN: [id number] Arrival date & time: 01/01/24  1323     History  Chief Complaint  Patient presents with   Allergic Reaction    Daniel Gill is a 33 y.o. male.  HPI     This is a 33 year old man who presents with concern for allergic reaction.  Patient reports that he has a known allergy to shellfish.  Today he was at work when he ate 2 coconut covered peanuts.  No prior known allergy to peanuts.  He began to feel like his throat was closing up.  It felt similar to when he reacted to shellfish.  He received 2 epinephrine injections by school staff.  He subsequently got oral Benadryl by EMS.  He currently is asymptomatic.  Denies any shortness of breath or ongoing throat swelling.  Home Medications Prior to Admission medications   Medication Sig Start Date End Date Taking? Authorizing Provider  diphenhydrAMINE (BENADRYL) 25 MG tablet Take 1 tablet (25 mg total) by mouth every 6 (six) hours as needed. 01/01/24  Yes Rickia Freeburg, Mayer Masker, MD  EPINEPHrine 0.3 mg/0.3 mL IJ SOAJ injection Inject 0.3 mg into the muscle as needed for anaphylaxis. 01/01/24  Yes Maston Wight, Mayer Masker, MD  predniSONE (DELTASONE) 20 MG tablet Take 2 tablets (40 mg total) by mouth daily. 01/01/24  Yes Karan Inclan, Mayer Masker, MD  lisdexamfetamine (VYVANSE) 30 MG capsule Take 1 capsule (30 mg total) by mouth daily. 10/09/19   Rodriguez-Southworth, Nettie Elm, PA-C      Allergies    Shellfish allergy, Latex, and Sulfa drugs cross reactors    Review of Systems   Review of Systems  Constitutional:  Negative for fever.  HENT:  Positive for trouble swallowing.   Respiratory:  Negative for shortness of breath.   Cardiovascular: Negative.   Gastrointestinal:  Positive for nausea.  All other systems reviewed and are negative.   Physical Exam Updated Vital Signs BP 120/82 (BP Location: Right Arm)   Pulse 79   Temp 97.9 F (36.6 C) (Oral)   Resp 20    SpO2 98%  Physical Exam Vitals and nursing note reviewed.  Constitutional:      Appearance: He is well-developed. He is obese. He is not ill-appearing.  HENT:     Head: Normocephalic and atraumatic.     Mouth/Throat:     Mouth: Mucous membranes are moist.     Comments: Posterior oropharynx clear, no obvious swelling noted Eyes:     Pupils: Pupils are equal, round, and reactive to light.  Cardiovascular:     Rate and Rhythm: Normal rate and regular rhythm.     Heart sounds: Normal heart sounds. No murmur heard. Pulmonary:     Effort: Pulmonary effort is normal. No respiratory distress.     Breath sounds: Normal breath sounds. No wheezing.  Abdominal:     General: Bowel sounds are normal.     Palpations: Abdomen is soft.     Tenderness: There is no abdominal tenderness. There is no rebound.  Musculoskeletal:     Cervical back: Neck supple.  Lymphadenopathy:     Cervical: No cervical adenopathy.  Skin:    General: Skin is warm and dry.     Findings: No rash.  Neurological:     Mental Status: He is alert and oriented to person, place, and time.  Psychiatric:        Mood and Affect: Mood normal.     ED Results /  Procedures / Treatments   Labs (all labs ordered are listed, but only abnormal results are displayed) Labs Reviewed - No data to display  EKG EKG Interpretation Date/Time:  Tuesday January 01 2024 15:38:22 EST Ventricular Rate:  88 PR Interval:  172 QRS Duration:  95 QT Interval:  370 QTC Calculation: 448 R Axis:   78  Text Interpretation: Sinus rhythm Atrial premature complexes Consider left atrial enlargement Borderline T wave abnormalities Confirmed by Ross Marcus ([id number]) on 01/01/2024 3:44:10 PM  Radiology No results found.  Procedures Procedures    Medications Ordered in ED Medications  famotidine (PEPCID) IVPB 20 mg premix (0 mg Intravenous Stopped 01/01/24 1717)  methylPREDNISolone sodium succinate (SOLU-MEDROL) 125 mg/2 mL injection 125  mg (125 mg Intravenous Given 01/01/24 1515)  ondansetron (ZOFRAN) injection 4 mg (4 mg Intravenous Given 01/01/24 1534)    ED Course/ Medical Decision Making/ A&P                                 Medical Decision Making Risk OTC drugs. Prescription drug management.   This patient presents to the ED for concern of anaphylactic reaction, this involves an extensive number of treatment options, and is a complaint that carries with it a high risk of complications and morbidity.  I considered the following differential and admission for this acute, potentially life threatening condition.  The differential diagnosis includes anaphylaxis  MDM:    This is a 33 year old male who presents with clinical history consistent with anaphylaxis.  He is nontoxic on my evaluation and vital signs are reassuring.  No signs of recurrent anaphylaxis.  He is not wheezing or in any respiratory distress.  He was given additional Pepcid and Solu-Medrol.  He was observed for approximately 5 hours from first administration of epinephrine.  He did have some nausea but had no further symptoms and the nausea did not seem to be consistent with ongoing anaphylaxis.  We discussed following up with allergy and immunology.  Avoid coconut and peanuts in the future.  He was given an epinephrine pen and a short course of steroids.  Benadryl as needed.  (Labs, imaging, consults)  Labs: I Ordered, and personally interpreted labs.  The pertinent results include: None  Imaging Studies ordered: I ordered imaging studies including none I independently visualized and interpreted imaging. I agree with the radiologist interpretation  Additional history obtained from chart review.  External records from outside source obtained and reviewed including prior evaluations  Cardiac Monitoring: The patient was maintained on a cardiac monitor.  If on the cardiac monitor, I personally viewed and interpreted the cardiac monitored which showed an  underlying rhythm of: Sinus  Reevaluation: After the interventions noted above, I reevaluated the patient and found that they have : Improved  Social Determinants of Health:  lives independently  Disposition:  d/c  Co morbidities that complicate the patient evaluation  Past Medical History:  Diagnosis Date   Allergy    Asthma    Most likely exercise induced   Heart murmur    Second degree AV block 05/2016   Sickle cell trait ([facility])      Medicines Meds ordered this encounter  Medications   famotidine (PEPCID) IVPB 20 mg premix   methylPREDNISolone sodium succinate (SOLU-MEDROL) 125 mg/2 mL injection 125 mg    IV methylprednisolone will be converted to either a q12h or q24h frequency with the same total daily dose (TDD).  Ordered  Dose: 1 to 125 mg TDD; convert to: TDD q24h.  Ordered Dose: 126 to 250 mg TDD; convert to: TDD div q12h.  Ordered Dose: >250 mg TDD; DAW.   ondansetron (ZOFRAN) injection 4 mg   EPINEPHrine 0.3 mg/0.3 mL IJ SOAJ injection    Sig: Inject 0.3 mg into the muscle as needed for anaphylaxis.    Dispense:  1 each    Refill:  0   diphenhydrAMINE (BENADRYL) 25 MG tablet    Sig: Take 1 tablet (25 mg total) by mouth every 6 (six) hours as needed.    Dispense:  30 tablet    Refill:  0   predniSONE (DELTASONE) 20 MG tablet    Sig: Take 2 tablets (40 mg total) by mouth daily.    Dispense:  10 tablet    Refill:  0    I have reviewed the patients home medicines and have made adjustments as needed  Problem List / ED Course: Problem List Items Addressed This Visit   None Visit Diagnoses       Anaphylaxis, initial encounter    -  Primary                   Final Clinical Impression(s) / ED Diagnoses Final diagnoses:  Anaphylaxis, initial encounter    Rx / DC Orders ED Discharge Orders          Ordered    EPINEPHrine 0.3 mg/0.3 mL IJ SOAJ injection  As needed        01/01/24 1750    diphenhydrAMINE (BENADRYL) 25 MG tablet  Every 6 hours  PRN        01/01/24 1750    predniSONE (DELTASONE) 20 MG tablet  Daily        01/01/24 1750              Braydyn Schultes, Mayer Masker, MD 01/01/24 1753

## 2024-01-05 ENCOUNTER — Other Ambulatory Visit: Payer: Self-pay

## 2024-01-05 ENCOUNTER — Encounter (HOSPITAL_BASED_OUTPATIENT_CLINIC_OR_DEPARTMENT_OTHER): Payer: Self-pay | Admitting: *Deleted

## 2024-01-05 ENCOUNTER — Emergency Department (HOSPITAL_BASED_OUTPATIENT_CLINIC_OR_DEPARTMENT_OTHER)
Admission: EM | Admit: 2024-01-05 | Discharge: 2024-01-05 | Disposition: A | Discharge status: Home / Self Care | Payer: Medicaid Other | Attending: Emergency Medicine | Admitting: Emergency Medicine

## 2024-01-05 ENCOUNTER — Emergency Department (HOSPITAL_BASED_OUTPATIENT_CLINIC_OR_DEPARTMENT_OTHER): Payer: Medicaid Other | Admitting: Radiology

## 2024-01-05 DIAGNOSIS — R079 Chest pain, unspecified: Secondary | ICD-10-CM | POA: Diagnosis present

## 2024-01-05 DIAGNOSIS — Z91010 Allergy to peanuts: Secondary | ICD-10-CM | POA: Diagnosis not present

## 2024-01-05 DIAGNOSIS — R000 Tachycardia, unspecified: Secondary | ICD-10-CM | POA: Insufficient documentation

## 2024-01-05 DIAGNOSIS — R072 Precordial pain: Secondary | ICD-10-CM | POA: Insufficient documentation

## 2024-01-05 DIAGNOSIS — Z91040 Latex allergy status: Secondary | ICD-10-CM | POA: Insufficient documentation

## 2024-01-05 LAB — BASIC METABOLIC PANEL
Anion gap: 10 (ref 5–15)
BUN: 17 mg/dL (ref 6–20)
CO2: 21 mmol/L — ABNORMAL LOW (ref 22–32)
Calcium: 8.7 mg/dL — ABNORMAL LOW (ref 8.9–10.3)
Chloride: 104 mmol/L (ref 98–111)
Creatinine, Ser: 1.1 mg/dL (ref 0.61–1.24)
GFR, Estimated: >60 mL/min (ref >60)
Glucose, Bld: 94 mg/dL (ref 70–99)
Potassium: 3.5 mmol/L (ref 3.5–5.1)
Sodium: 135 mmol/L (ref 135–145)

## 2024-01-05 LAB — CBC
HCT: 43.8 % (ref 39.0–52.0)
Hemoglobin: 15.5 g/dL (ref 13.0–17.0)
MCH: 29.7 pg (ref 26.0–34.0)
MCHC: 35.4 g/dL (ref 30.0–36.0)
MCV: 83.9 fL (ref 80.0–100.0)
Platelets: 274 10*3/uL (ref 150–400)
RBC: 5.22 MIL/uL (ref 4.22–5.81)
RDW: 13 % (ref 11.5–15.5)
WBC: 9.2 10*3/uL (ref 4.0–10.5)
nRBC: 0 % (ref 0.0–0.2)

## 2024-01-05 LAB — TROPONIN I (HIGH SENSITIVITY)
Troponin I (High Sensitivity): 3 ng/L (ref <18)
Troponin I (High Sensitivity): 2 ng/L (ref <18)

## 2024-01-05 LAB — D-DIMER, QUANTITATIVE: D-Dimer, Quant: 0.42 ug{FEU}/mL (ref 0.00–0.50)

## 2024-01-05 NOTE — ED Triage Notes (Signed)
Pt to ED reporting tachycardia while trying to donate plasma yesterday. Pt reports feeling like his heart has been racing intermittently since and now has centralized chest pain and "fullness" with increased pain when coughing.   Hx of pericarditis in 2018. No hx of Afib, Afib on triage EKG.

## 2024-01-05 NOTE — ED Provider Notes (Signed)
[place] EMERGENCY DEPARTMENT AT [facility] Provider Note   CSN: [id number] Arrival date & time: 01/05/24  1845     History  Chief Complaint  Patient presents with   Chest Pain    Daniel Gill is a 33 y.o. male.  Patient with history of pericarditis in 2018 presents to the emergency department for evaluation of tachycardia and chest pain.  Patient states that he went to give plasma earlier this afternoon.  He was in his usual state of health.  He was deferred because of tachycardia, heart rate in the 110's.  He states he was driving home and developed chest pain and shortness of breath.  States tachypalpitations.  He has a fullness in the chest.  Improving now but still present.  States that he feels like he is breathing "like I am outside in the cold".  No syncope.  No lower extremity swelling.  Denies recent viral illnesses.  Patient denies risk factors for pulmonary embolism including: unilateral leg swelling, history of DVT/PE/other blood clots, use of exogenous hormones, recent immobilizations, recent surgery, recent travel (>4hr segment), malignancy, hemoptysis.          Home Medications Prior to Admission medications   Medication Sig Start Date End Date Taking? Authorizing Provider  diphenhydrAMINE (BENADRYL) 25 MG tablet Take 1 tablet (25 mg total) by mouth every 6 (six) hours as needed. 01/01/24   Horton, Mayer Masker, MD  EPINEPHrine 0.3 mg/0.3 mL IJ SOAJ injection Inject 0.3 mg into the muscle as needed for anaphylaxis. 01/01/24   Horton, Mayer Masker, MD  lisdexamfetamine (VYVANSE) 30 MG capsule Take 1 capsule (30 mg total) by mouth daily. 10/09/19   Rodriguez-Southworth, Nettie Elm, PA-C  predniSONE (DELTASONE) 20 MG tablet Take 2 tablets (40 mg total) by mouth daily. 01/01/24   Horton, Mayer Masker, MD      Allergies    Peanut oil, Shellfish allergy, Latex, and Sulfa drugs cross reactors    Review of Systems   Review of Systems  Physical Exam Updated Vital  Signs BP (!) 119/94   Pulse 71   Temp 98.4 F (36.9 C) (Oral)   Resp 12   Ht 5\' 8"  (1.727 m)   Wt 99.8 kg   SpO2 99%   BMI 33.45 kg/m   Physical Exam Vitals and nursing note reviewed.  Constitutional:      Appearance: He is well-developed. He is not diaphoretic.  HENT:     Head: Normocephalic and atraumatic.     Mouth/Throat:     Mouth: Mucous membranes are not dry.  Eyes:     Conjunctiva/sclera: Conjunctivae normal.  Neck:     Vascular: Normal carotid pulses. No carotid bruit or JVD.     Trachea: Trachea normal. No tracheal deviation.  Cardiovascular:     Rate and Rhythm: Normal rate and regular rhythm.     Pulses: No decreased pulses.          Radial pulses are 2+ on the right side and 2+ on the left side.     Heart sounds: Normal heart sounds, S1 normal and S2 normal. Heart sounds not distant. No murmur heard. Pulmonary:     Effort: Pulmonary effort is normal. No respiratory distress.     Breath sounds: Normal breath sounds. No wheezing.  Chest:     Chest wall: No tenderness.  Abdominal:     General: Bowel sounds are normal.     Palpations: Abdomen is soft.     Tenderness: There is no abdominal  tenderness. There is no guarding or rebound.  Musculoskeletal:     Cervical back: Normal range of motion and neck supple. No muscular tenderness.     Right lower leg: No edema.     Left lower leg: No edema.  Skin:    General: Skin is warm and dry.     Coloration: Skin is not pale.  Neurological:     Mental Status: He is alert. Mental status is at baseline.  Psychiatric:        Mood and Affect: Mood normal.     ED Results / Procedures / Treatments   Labs (all labs ordered are listed, but only abnormal results are displayed) Labs Reviewed  BASIC METABOLIC PANEL - Abnormal; Notable for the following components:      Result Value   CO2 21 (*)    Calcium 8.7 (*)    All other components within normal limits  CBC  D-DIMER, QUANTITATIVE  TROPONIN I (HIGH SENSITIVITY)   TROPONIN I (HIGH SENSITIVITY)    ED ECG REPORT   Date: 01/05/2024  Rate: 81  Rhythm: normal sinus rhythm and premature atrial contractions (PAC)  QRS Axis: normal  Intervals: normal  ST/T Wave abnormalities: normal  Conduction Disutrbances:none  Narrative Interpretation:   Old EKG Reviewed: unchanged  I have personally reviewed the EKG tracing and agree with the computerized printout as noted.   Radiology DG Chest 2 View Result Date: 01/05/2024 CLINICAL DATA:  Tachycardia EXAM: CHEST - 2 VIEW COMPARISON:  None Available. FINDINGS: Normal lung volumes. Rounded opacity projecting over the medial right cardiophrenic angle. No pleural effusion or pneumothorax. The heart size and mediastinal contours are within normal limits. No acute osseous abnormality. IMPRESSION: Rounded opacity projecting over the medial right cardiophrenic angle, which may represent prominent pericardial fat pad, diaphragmatic hernia, or pericardial cyst. Recommend further evaluation with nonemergent chest CT. Electronically Signed   By: Agustin Cree M.D.   On: 01/05/2024 20:23    Procedures Procedures    Medications Ordered in ED Medications - No data to display  ED Course/ Medical Decision Making/ A&P    Patient seen and examined. History obtained directly from patient. Work-up including labs, imaging, EKG ordered in triage, if performed, were reviewed.    Labs/EKG: Independently reviewed and interpreted.  This included: CBC unremarkable; BMP unremarkable; first troponin normal at 3.  Added D-dimer, awaiting second troponin.  Reviewed arrival EKG, poor baseline with artifact, do not suspect atrial fibrillation.  Rate 90s.  Imaging: Independently visualized and interpreted.  This included: Chest x-ray, agree no infiltrate.  There is a rounded opacity over the right cardiophrenic angle noted by radiology which will require nonemergent chest CT.  Medications/Fluids: Ordered: None ordered  Most recent vital  signs reviewed and are as follows: BP (!) 119/94   Pulse 71   Temp 98.4 F (36.9 C) (Oral)   Resp 12   Ht 5\' 8"  (1.727 m)   Wt 99.8 kg   SpO2 99%   BMI 33.45 kg/m   Initial impression: Chest pain and palpitations  10:59 PM Reassessment performed. Patient appears stable.  Labs personally reviewed and interpreted including: Troponin 3 >> 2, D-dimer normal.  Reviewed pertinent lab work and imaging with patient at bedside.  We discussed need for outpatient follow-up due to chest x-ray abnormality.  Questions answered.   Most current vital signs reviewed and are as follows: BP (!) 118/96   Pulse 75   Temp 98.4 F (36.9 C) (Oral)   Resp 14  Ht 5\' 8"  (1.727 m)   Wt 99.8 kg   SpO2 100%   BMI 33.45 kg/m   Plan: Discharge to home.   Prescriptions written for: None  Other home care instructions discussed: Monitoring of symptoms  ED return instructions discussed: Return and follow-up instructions: I encouraged patient to return to ED with severe chest pain, especially if the pain is crushing or pressure-like and spreads to the arms, back, neck, or jaw, or if they have associated sweating, vomiting, or shortness of breath with the pain, or significant pain with activity. We discussed that the evaluation here today indicates a low-risk of serious cause of chest pain, including heart trouble or a blood clot, but no evaluation is perfect and chest pain can evolve with time. The patient verbalized understanding and agreed.  I encouraged patient to follow-up with their provider in the next 48 hours for recheck.                                  Medical Decision Making Amount and/or Complexity of Data Reviewed Labs: ordered. Radiology: ordered.   For this patient's complaint of chest pain, the following emergent conditions were considered on the differential diagnosis: acute coronary syndrome, pulmonary embolism, pneumothorax, myocarditis, pericardial tamponade, aortic dissection,  thoracic aortic aneurysm complication, esophageal perforation.   Other causes were also considered including: gastroesophageal reflux disease, musculoskeletal pain including costochondritis, pneumonia/pleurisy, herpes zoster, pericarditis.  In regards to possibility of ACS, patient has atypical features of pain, non-ischemic and unchanged EKG and negative troponin(s). Heart score was calculated to be 1.   In regards to possibility of PE, symptoms are atypical for PE and risk profile is low, making PE low likelihood, d-dimer negative.   The patient's vital signs, pertinent lab work and imaging were reviewed and interpreted as discussed in the ED course. Hospitalization was considered for further testing, treatments, or serial exams/observation. However as patient is well-appearing, has a stable exam, and reassuring studies today, I do not feel that they warrant admission at this time. This plan was discussed with the patient who verbalizes agreement and comfort with this plan and seems reliable and able to return to the Emergency Department with worsening or changing symptoms.          Final Clinical Impression(s) / ED Diagnoses Final diagnoses:  Precordial pain    Rx / DC Orders ED Discharge Orders     None         Renne Crigler, PA-C 01/05/24 2301    Glyn Ade, MD 01/06/24 [phone]

## 2024-01-05 NOTE — Discharge Instructions (Addendum)
Please read and follow all provided instructions.  Your diagnoses today include:  1. Precordial pain     Tests performed today include: An EKG of your heart A chest x-ray -- shows abnormality on the right side, will need to follow-up with PCP for considering of imaging Cardiac enzymes - a blood test for heart muscle damage Blood counts and electrolytes D-dimer -screening test for blood clot was negative Vital signs. See below for your results today.   Medications prescribed:  Please use over-the-counter NSAID medications (ibuprofen, naproxen) or Tylenol (acetaminophen) as directed on the packaging for pain -- as long as you do not have any reasons avoid these medications. Reasons to avoid NSAID medications include: weak kidneys, a history of bleeding in your stomach or gut, or uncontrolled high blood pressure or previous heart attack. Reasons to avoid Tylenol include: liver problems or ongoing alcohol use. Never take more than 4000mg  or 8 Extra strength Tylenol in a 24 hour period.     Take any prescribed medications only as directed.  Follow-up instructions: Please follow-up with your primary care provider as soon as you can for further evaluation of your symptoms.   Return instructions:  SEEK IMMEDIATE MEDICAL ATTENTION IF: You have severe chest pain, especially if the pain is crushing or pressure-like and spreads to the arms, back, neck, or jaw, or if you have sweating, nausea or vomiting, or trouble with breathing. THIS IS AN EMERGENCY. Do not wait to see if the pain will go away. Get medical help at once. Call 911. DO NOT drive yourself to the hospital.  Your chest pain gets worse and does not go away after a few minutes of rest.  You have an attack of chest pain lasting longer than what you usually experience.  You have significant dizziness, if you pass out, or have trouble walking.  You have chest pain not typical of your usual pain for which you originally saw your caregiver.   You have any other emergent concerns regarding your health.  Additional Information: Chest pain comes from many different causes. Your caregiver has diagnosed you as having chest pain that is not specific for one problem, but does not require admission.  You are at low risk for an acute heart condition or other serious illness.   Your vital signs today were: BP (!) 118/96   Pulse 75   Temp 98.4 F (36.9 C) (Oral)   Resp 14   Ht 5\' 8"  (1.727 m)   Wt 99.8 kg   SpO2 100%   BMI 33.45 kg/m  If your blood pressure (BP) was elevated above 135/85 this visit, please have this repeated by your doctor within one month. --------------

## 2024-04-19 ENCOUNTER — Encounter (HOSPITAL_COMMUNITY): Payer: Self-pay

## 2024-04-19 ENCOUNTER — Other Ambulatory Visit: Payer: Self-pay

## 2024-04-19 ENCOUNTER — Emergency Department (HOSPITAL_COMMUNITY)

## 2024-04-19 ENCOUNTER — Emergency Department (HOSPITAL_COMMUNITY)
Admission: EM | Admit: 2024-04-19 | Discharge: 2024-04-19 | Discharge status: Against Medical Advice | Attending: Emergency Medicine | Admitting: Emergency Medicine

## 2024-04-19 DIAGNOSIS — R0602 Shortness of breath: Secondary | ICD-10-CM | POA: Diagnosis present

## 2024-04-19 DIAGNOSIS — R002 Palpitations: Secondary | ICD-10-CM | POA: Insufficient documentation

## 2024-04-19 DIAGNOSIS — Z5321 Procedure and treatment not carried out due to patient leaving prior to being seen by health care provider: Secondary | ICD-10-CM | POA: Insufficient documentation

## 2024-04-19 HISTORY — DX: Disease of pericardium, unspecified: I31.9

## 2024-04-19 LAB — CBC WITH DIFFERENTIAL/PLATELET
Abs Immature Granulocytes: 0.03 10*3/uL (ref 0.00–0.07)
Basophils Absolute: 0.1 10*3/uL (ref 0.0–0.1)
Basophils Relative: 1 %
Eosinophils Absolute: 0.2 10*3/uL (ref 0.0–0.5)
Eosinophils Relative: 2 %
HCT: 43.6 % (ref 39.0–52.0)
Hemoglobin: 14.8 g/dL (ref 13.0–17.0)
Immature Granulocytes: 0 %
Lymphocytes Relative: 27 %
Lymphs Abs: 2.9 10*3/uL (ref 0.7–4.0)
MCH: 30 pg (ref 26.0–34.0)
MCHC: 33.9 g/dL (ref 30.0–36.0)
MCV: 88.4 fL (ref 80.0–100.0)
Monocytes Absolute: 0.8 10*3/uL (ref 0.1–1.0)
Monocytes Relative: 7 %
Neutro Abs: 7 10*3/uL (ref 1.7–7.7)
Neutrophils Relative %: 63 %
Platelets: 250 10*3/uL (ref 150–400)
RBC: 4.93 MIL/uL (ref 4.22–5.81)
RDW: 13 % (ref 11.5–15.5)
WBC: 11 10*3/uL — ABNORMAL HIGH (ref 4.0–10.5)
nRBC: 0 % (ref 0.0–0.2)

## 2024-04-19 LAB — BASIC METABOLIC PANEL WITH GFR
Anion gap: 8 (ref 5–15)
BUN: 16 mg/dL (ref 6–20)
CO2: 24 mmol/L (ref 22–32)
Calcium: 8.7 mg/dL — ABNORMAL LOW (ref 8.9–10.3)
Chloride: 105 mmol/L (ref 98–111)
Creatinine, Ser: 0.97 mg/dL (ref 0.61–1.24)
GFR, Estimated: >60 mL/min (ref >60)
Glucose, Bld: 114 mg/dL — ABNORMAL HIGH (ref 70–99)
Potassium: 3.7 mmol/L (ref 3.5–5.1)
Sodium: 137 mmol/L (ref 135–145)

## 2024-04-19 LAB — TROPONIN I (HIGH SENSITIVITY): Troponin I (High Sensitivity): 3 ng/L (ref <18)

## 2024-04-19 NOTE — ED Notes (Signed)
 No answer in wr.

## 2024-04-19 NOTE — ED Notes (Signed)
 No answer

## 2024-04-19 NOTE — ED Triage Notes (Signed)
 Pt coming in reporting SOB/palpitations/jitteriness beginning 30-45 minutes ago while resting and progressively worsening. Hx of pericarditis. No respiratory distress noted.
